# Patient Record
Sex: Female | Born: 1990 | Race: Black or African American | Hispanic: No | Marital: Single | Smoking: Current some day smoker
Health system: Southern US, Community
[De-identification: ages and names within clinical notes are randomized; demographics above are authoritative.]

## PROBLEM LIST (undated history)

## (undated) DIAGNOSIS — E162 Hypoglycemia, unspecified: Secondary | ICD-10-CM

## (undated) DIAGNOSIS — D649 Anemia, unspecified: Secondary | ICD-10-CM

## (undated) DIAGNOSIS — F329 Major depressive disorder, single episode, unspecified: Secondary | ICD-10-CM

## (undated) DIAGNOSIS — A749 Chlamydial infection, unspecified: Secondary | ICD-10-CM

## (undated) DIAGNOSIS — B999 Unspecified infectious disease: Secondary | ICD-10-CM

## (undated) DIAGNOSIS — F32A Depression, unspecified: Secondary | ICD-10-CM

## (undated) DIAGNOSIS — F419 Anxiety disorder, unspecified: Secondary | ICD-10-CM

## (undated) DIAGNOSIS — J45909 Unspecified asthma, uncomplicated: Secondary | ICD-10-CM

## (undated) DIAGNOSIS — R519 Headache, unspecified: Secondary | ICD-10-CM

## (undated) HISTORY — PX: DILATION AND CURETTAGE OF UTERUS: SHX78

---
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
## 2018-10-04 ENCOUNTER — Other Ambulatory Visit: Payer: Self-pay

## 2018-10-04 ENCOUNTER — Encounter (HOSPITAL_COMMUNITY): Payer: Self-pay

## 2018-10-04 ENCOUNTER — Inpatient Hospital Stay (HOSPITAL_COMMUNITY): Payer: Self-pay

## 2018-10-04 ENCOUNTER — Inpatient Hospital Stay (HOSPITAL_COMMUNITY)
Admission: AD | Admit: 2018-10-04 | Discharge: 2018-10-04 | Disposition: A | Discharge status: Home / Self Care | Payer: Self-pay | Attending: Obstetrics and Gynecology | Admitting: Obstetrics and Gynecology

## 2018-10-04 DIAGNOSIS — F17210 Nicotine dependence, cigarettes, uncomplicated: Secondary | ICD-10-CM | POA: Insufficient documentation

## 2018-10-04 DIAGNOSIS — N83291 Other ovarian cyst, right side: Secondary | ICD-10-CM | POA: Insufficient documentation

## 2018-10-04 DIAGNOSIS — Z3A10 10 weeks gestation of pregnancy: Secondary | ICD-10-CM | POA: Insufficient documentation

## 2018-10-04 DIAGNOSIS — Z3A13 13 weeks gestation of pregnancy: Secondary | ICD-10-CM

## 2018-10-04 DIAGNOSIS — O99331 Smoking (tobacco) complicating pregnancy, first trimester: Secondary | ICD-10-CM | POA: Insufficient documentation

## 2018-10-04 DIAGNOSIS — O3481 Maternal care for other abnormalities of pelvic organs, first trimester: Secondary | ICD-10-CM | POA: Insufficient documentation

## 2018-10-04 DIAGNOSIS — O209 Hemorrhage in early pregnancy, unspecified: Secondary | ICD-10-CM | POA: Insufficient documentation

## 2018-10-04 HISTORY — DX: Anxiety disorder, unspecified: F41.9

## 2018-10-04 HISTORY — DX: Anemia, unspecified: D64.9

## 2018-10-04 LAB — CBC WITH DIFFERENTIAL/PLATELET
Abs Immature Granulocytes: 0.03 10*3/uL (ref 0.00–0.07)
Basophils Absolute: 0.1 10*3/uL (ref 0.0–0.1)
Basophils Relative: 1 %
Eosinophils Absolute: 0.2 10*3/uL (ref 0.0–0.5)
Eosinophils Relative: 2 %
HCT: 36.1 % (ref 36.0–46.0)
Hemoglobin: 12.4 g/dL (ref 12.0–15.0)
Immature Granulocytes: 0 %
Lymphocytes Relative: 32 %
Lymphs Abs: 3.5 10*3/uL (ref 0.7–4.0)
MCH: 31.3 pg (ref 26.0–34.0)
MCHC: 34.3 g/dL (ref 30.0–36.0)
MCV: 91.2 fL (ref 80.0–100.0)
Monocytes Absolute: 0.7 10*3/uL (ref 0.1–1.0)
Monocytes Relative: 7 %
Neutro Abs: 6.6 10*3/uL (ref 1.7–7.7)
Neutrophils Relative %: 58 %
Platelets: 238 10*3/uL (ref 150–400)
RBC: 3.96 MIL/uL (ref 3.87–5.11)
RDW: 13 % (ref 11.5–15.5)
WBC: 11.1 10*3/uL — ABNORMAL HIGH (ref 4.0–10.5)
nRBC: 0 % (ref 0.0–0.2)

## 2018-10-04 LAB — URINALYSIS, ROUTINE W REFLEX MICROSCOPIC
Bilirubin Urine: NEGATIVE
Glucose, UA: NEGATIVE mg/dL
Ketones, ur: NEGATIVE mg/dL
Leukocytes,Ua: NEGATIVE
Nitrite: NEGATIVE
Protein, ur: NEGATIVE mg/dL
Specific Gravity, Urine: 1.017 (ref 1.005–1.030)
pH: 5 (ref 5.0–8.0)

## 2018-10-04 LAB — HCG, QUANTITATIVE, PREGNANCY: hCG, Beta Chain, Quant, S: 149221 m[IU]/mL — ABNORMAL HIGH (ref <5)

## 2018-10-04 LAB — ABO/RH: ABO/RH(D): A POS

## 2018-10-04 LAB — POCT PREGNANCY, URINE: Preg Test, Ur: POSITIVE — AB

## 2018-10-04 MED ORDER — PREPLUS 27-1 MG PO TABS: 1.0000 | ORAL_TABLET | Freq: Every day | ORAL | 13 refills | Status: AC

## 2018-10-04 NOTE — Discharge Instructions (Signed)
First Trimester of Pregnancy ° °The first trimester of pregnancy is from week 1 until the end of week 13 (months 1 through 3). During this time, your baby will begin to develop inside you. At 6-8 weeks, the eyes and face are formed, and the heartbeat can be seen on ultrasound. At the end of 12 weeks, all the baby's organs are formed. Prenatal care is all the medical care you receive before the birth of your baby. Make sure you get good prenatal care and follow all of your doctor's instructions. °Follow these instructions at home: °Medicines °· Take over-the-counter and prescription medicines only as told by your doctor. Some medicines are safe and some medicines are not safe during pregnancy. °· Take a prenatal vitamin that contains at least 600 micrograms (mcg) of folic acid. °· If you have trouble pooping (constipation), take medicine that will make your stool soft (stool softener) if your doctor approves. °Eating and drinking ° °· Eat regular, healthy meals. °· Your doctor will tell you the amount of weight gain that is right for you. °· Avoid raw meat and uncooked cheese. °· If you feel sick to your stomach (nauseous) or throw up (vomit): °? Eat 4 or 5 small meals a day instead of 3 large meals. °? Try eating a few soda crackers. °? Drink liquids between meals instead of during meals. °· To prevent constipation: °? Eat foods that are high in fiber, like fresh fruits and vegetables, whole grains, and beans. °? Drink enough fluids to keep your pee (urine) clear or pale yellow. °Activity °· Exercise only as told by your doctor. Stop exercising if you have cramps or pain in your lower belly (abdomen) or low back. °· Do not exercise if it is too hot, too humid, or if you are in a place of great height (high altitude). °· Try to avoid standing for long periods of time. Move your legs often if you must stand in one place for a long time. °· Avoid heavy lifting. °· Wear low-heeled shoes. Sit and stand up  straight. °· You can have sex unless your doctor tells you not to. °Relieving pain and discomfort °· Wear a good support bra if your breasts are sore. °· Take warm water baths (sitz baths) to soothe pain or discomfort caused by hemorrhoids. Use hemorrhoid cream if your doctor says it is okay. °· Rest with your legs raised if you have leg cramps or low back pain. °· If you have puffy, bulging veins (varicose veins) in your legs: °? Wear support hose or compression stockings as told by your doctor. °? Raise (elevate) your feet for 15 minutes, 3-4 times a day. °? Limit salt in your food. °Prenatal care °· Schedule your prenatal visits by the twelfth week of pregnancy. °· Write down your questions. Take them to your prenatal visits. °· Keep all your prenatal visits as told by your doctor. This is important. °Safety °· Wear your seat belt at all times when driving. °· Make a list of emergency phone numbers. The list should include numbers for family, friends, the hospital, and police and fire departments. °General instructions °· Ask your doctor for a referral to a local prenatal class. Begin classes no later than at the start of month 6 of your pregnancy. °· Ask for help if you need counseling or if you need help with nutrition. Your doctor can give you advice or tell you where to go for help. °· Do not use hot tubs, steam   rooms, or saunas.  Do not douche or use tampons or scented sanitary pads.  Do not cross your legs for long periods of time.  Avoid all herbs and alcohol. Avoid drugs that are not approved by your doctor.  Do not use any tobacco products, including cigarettes, chewing tobacco, and electronic cigarettes. If you need help quitting, ask your doctor. You may get counseling or other support to help you quit.  Avoid cat litter boxes and soil used by cats. These carry germs that can cause birth defects in the baby and can cause a loss of your baby (miscarriage) or stillbirth.  Visit your dentist.  At home, brush your teeth with a soft toothbrush. Be gentle when you floss. Contact a doctor if:  You are dizzy.  You have mild cramps or pressure in your lower belly.  You have a nagging pain in your belly area.  You continue to feel sick to your stomach, you throw up, or you have watery poop (diarrhea).  You have a bad smelling fluid coming from your vagina.  You have pain when you pee (urinate).  You have increased puffiness (swelling) in your face, hands, legs, or ankles. Get help right away if:  You have a fever.  You are leaking fluid from your vagina.  You have spotting or bleeding from your vagina.  You have very bad belly cramping or pain.  You gain or lose weight rapidly.  You throw up blood. It may look like coffee grounds.  You are around people who have [place] measles, fifth disease, or chickenpox.  You have a very bad headache.  You have shortness of breath.  You have any kind of trauma, such as from a fall or a car accident. Summary  The first trimester of pregnancy is from week 1 until the end of week 13 (months 1 through 3).  To take care of yourself and your unborn baby, you will need to eat healthy meals, take medicines only if your doctor tells you to do so, and do activities that are safe for you and your baby.  Keep all follow-up visits as told by your doctor. This is important as your doctor will have to ensure that your baby is healthy and growing well. This information is not intended to replace advice given to you by your health care provider. Make sure you discuss any questions you have with your health care provider. Document Released: 09/02/2007 Document Revised: 07/07/2018 Document Reviewed: 03/24/2016 Elsevier Patient Education  2020 [place] for [place] @ [facility]   Phone: [place] for [place] @ [facility]   Phone: [place] @[place]  [facility]       Phone: [place] for [place] @ [place]     Phone: [place] for [place] @ [employer] [facility]   Phone: [place] for [place] @ [facility]  Phone: [place] for [place] @ [facility] Phone: [place] [facility]  Phone: [facility]  Phone: [facility] Phone: [phone]  [facility] Phone: [phone]  [facility] Phone: [phone]  [facility] Phone: [phone]  [facility]  Phone: [phone]

## 2018-10-04 NOTE — MAU Provider Note (Signed)
History     CSN: [id number]  Arrival date and time: 10/04/18 0737   First Provider Initiated Contact with Patient 10/04/18 [phone]     Chief Complaint  Patient presents with  . Vaginal Bleeding  . Abdominal Pain   HPI Oris Calmes is a 28 y.o. [id number] at [redacted]w[redacted]d who presents to MAU via EMS with chief complaints of vaginal bleeding and abdominal pain. These are new problems, onset this morning around 0300. She reports continuous bleeding since that time. Her abdominal pain is suprapubic and waxes and wanes. She denies aggravating or alleviating factors. She rates her pain as 6/10 upon CNM initial introduction.  She denies recent intercourse, illness, abdominal tenderness, dysuria, fever.  OB History    Gravida  6   Para      Term      Preterm      AB  3   Living  2     SAB  2   TAB  1   Ectopic      Multiple      Live Births              Past Medical History:  Diagnosis Date  . Anemia   . Anxiety     History reviewed. No pertinent surgical history.  History reviewed. No pertinent family history.  Social History   Tobacco Use  . Smoking status: Current Some Day Smoker    Packs/day: 0.25    Years: 15.00    Pack years: 3.75    Types: Cigarettes  . Smokeless tobacco: Never Used  Substance Use Topics  . Alcohol use: Not on file  . Drug use: Not on file    Allergies:  Allergies  Allergen Reactions  . Tylenol [Acetaminophen] Hives    No medications prior to admission.    Review of Systems  Constitutional: Positive for fever.  Gastrointestinal: Positive for abdominal pain.  Genitourinary: Positive for vaginal bleeding. Negative for difficulty urinating, dysuria and flank pain.  Musculoskeletal: Negative for back pain.  Neurological: Negative for syncope and weakness.  All other systems reviewed and are negative.  Physical Exam   Blood pressure (!) 107/47, pulse 72, temperature 98.5 F (36.9 C), temperature source Oral, resp. rate 18,  height 5\' 3"  (1.6 m), last menstrual period 06/30/2018, SpO2 97 %.  Physical Exam  Nursing note and vitals reviewed. Constitutional: She is oriented to person, place, and time. She appears well-developed and well-nourished.  Cardiovascular: Normal rate.  Respiratory: Effort normal.  GI: Soft. Bowel sounds are normal. She exhibits no distension. There is no abdominal tenderness. There is no rebound and no guarding.  Genitourinary:    Vaginal discharge present.     Genitourinary Comments: Scant red-tinged mucous throughout vaginal vault. Removed with fox swab x 1.   Neurological: She is alert and oriented to person, place, and time.  Skin: Skin is warm and dry.  Psychiatric: She has a normal mood and affect. Her behavior is normal. Judgment and thought content normal.    MAU Course/MDM  Procedures: sterile speculum exam  --Hematuria noted on UA. Pertinent negatives include dysuria, foul-smelling urine, fever, hx UTIs or kidney stones. Discussed warning signs with patient prior to discharge. --Patient is sleeping following [place], denies pain at time of discharge  Patient Vitals for the past 24 hrs:  BP Temp Temp src Pulse Resp SpO2 Height  10/04/18 0547 (!) 107/47 98.5 F (36.9 C) Oral 72 18 97 % -  10/04/18 0413 (!) 109/58 - -  74 - 99 % -  10/04/18 0400 (!) 104/48 98.5 F (36.9 C) Oral 80 18 100 % 5\' 3"  (1.6 m)    Results for orders placed or performed during the hospital encounter of 10/04/18 (from the past 24 hour(s))  Pregnancy, urine POC     Status: Abnormal   Collection Time: 10/04/18  3:55 AM  Result Value Ref Range   Preg Test, Ur POSITIVE (A) NEGATIVE  Urinalysis, Routine w reflex microscopic     Status: Abnormal   Collection Time: 10/04/18  4:12 AM  Result Value Ref Range   Color, Urine YELLOW YELLOW   APPearance CLEAR CLEAR   Specific Gravity, Urine 1.017 1.005 - 1.030   pH 5.0 5.0 - 8.0   Glucose, UA NEGATIVE NEGATIVE mg/dL   Hgb urine dipstick LARGE (A) NEGATIVE    Bilirubin Urine NEGATIVE NEGATIVE   Ketones, ur NEGATIVE NEGATIVE mg/dL   Protein, ur NEGATIVE NEGATIVE mg/dL   Nitrite NEGATIVE NEGATIVE   Leukocytes,Ua NEGATIVE NEGATIVE   RBC / HPF 11-20 0 - 5 RBC/hpf   WBC, UA 0-5 0 - 5 WBC/hpf   Bacteria, UA RARE (A) NONE SEEN   Squamous Epithelial / LPF 0-5 0 - 5   Mucus PRESENT   CBC with Differential/Platelet     Status: Abnormal   Collection Time: 10/04/18  4:50 AM  Result Value Ref Range   WBC 11.1 (H) 4.0 - 10.5 K/uL   RBC 3.96 3.87 - 5.11 MIL/uL   Hemoglobin 12.4 12.0 - 15.0 g/dL   HCT 40.936.1 81.136.0 - 91.446.0 %   MCV 91.2 80.0 - 100.0 fL   MCH 31.3 26.0 - 34.0 pg   MCHC 34.3 30.0 - 36.0 g/dL   RDW 78.213.0 95.611.5 - 21.315.5 %   Platelets 238 150 - 400 K/uL   nRBC 0.0 0.0 - 0.2 %   Neutrophils Relative % 58 %   Neutro Abs 6.6 1.7 - 7.7 K/uL   Lymphocytes Relative 32 %   Lymphs Abs 3.5 0.7 - 4.0 K/uL   Monocytes Relative 7 %   Monocytes Absolute 0.7 0.1 - 1.0 K/uL   Eosinophils Relative 2 %   Eosinophils Absolute 0.2 0.0 - 0.5 K/uL   Basophils Relative 1 %   Basophils Absolute 0.1 0.0 - 0.1 K/uL   Immature Granulocytes 0 %   Abs Immature Granulocytes 0.03 0.00 - 0.07 K/uL  ABO/Rh     Status: None   Collection Time: 10/04/18  4:50 AM  Result Value Ref Range   ABO/RH(D) A POS    No rh immune globuloin      NOT A RH IMMUNE GLOBULIN CANDIDATE, PT RH POSITIVE Performed at [facility], [address] [place], [place] [address]   hCG, quantitative, pregnancy     Status: Abnormal   Collection Time: 10/04/18  4:50 AM  Result Value Ref Range   hCG, Beta Chain, Quant, S 149,221 (H) <5 mIU/mL   [place] Ob Less Than 14 Weeks With Ob Transvaginal  Result Date: 10/04/2018 CLINICAL DATA:  Pregnancy.  Cramping.  Bleeding. EXAM: OBSTETRIC <14 WK ULTRASOUND TECHNIQUE: Transabdominal ultrasound was performed for evaluation of the gestation as well as the maternal uterus and adnexal regions. COMPARISON:  No prior. FINDINGS: Intrauterine gestational sac:  Single Yolk sac:  No Embryo:  Yes Cardiac Activity: Yes Heart Rate: 150 bpm CRL: 36.2 mm   10 w 4 d  US EDC: 04/27/2000 Subchorionic hemorrhage:  None Maternal uterus/adnexae: Prominent vasculature noted about the uterus most likely related to the patient's pregnancy. Tiny right ovarian complex cyst most likely corpus luteal cyst. IMPRESSION: 1.  Single viable intrauterine pregnancy at 10 weeks 4 days. 2. Prominent vasculature noted about the uterus most likely related to the patient's pregnancy. Continued follow-up suggested to demonstrate stability. Tiny right ovarian corpus luteal cyst. Electronically Signed   By: Maisie Fushomas  Register   On: 10/04/2018 05:51   Meds ordered this encounter  Medications  . Prenatal Vit-Fe Fumarate-FA (PREPLUS) 27-1 MG TABS    Sig: Take 1 tablet by mouth daily.    Dispense:  30 tablet    Refill:  13    Order Specific Question:   Supervising Provider    Answer:   Hermina StaggersERVIN, MICHAEL L [[id number]]   Assessment and Plan  --28 y.o. [id number] at 964w4d by US  --A POS blood type --Discharge home in stable condition with first trimester precautions.  F/U: Patient to establish prenatal care around 11-[redacted] weeks gestation  Calvert CantorSamantha C , [place] 10/04/2018, 6:39 AM

## 2018-10-04 NOTE — MAU Note (Addendum)
Patient presents to MAU via EMS c/o abdominal pain that started yesterday and vaginal bleeding that started around 0300 this morning.  Patient reports unconfirmed pregnancy. Patient reports LMP April 2.

## 2018-11-20 ENCOUNTER — Emergency Department (HOSPITAL_COMMUNITY): Admission: EM | Admit: 2018-11-20 | Discharge: 2018-11-20 | Discharge status: Absent without leave | Payer: Self-pay

## 2018-11-22 ENCOUNTER — Inpatient Hospital Stay (HOSPITAL_BASED_OUTPATIENT_CLINIC_OR_DEPARTMENT_OTHER): Payer: Self-pay

## 2018-11-22 ENCOUNTER — Encounter (HOSPITAL_COMMUNITY): Payer: Self-pay | Admitting: *Deleted

## 2018-11-22 ENCOUNTER — Other Ambulatory Visit: Payer: Self-pay

## 2018-11-22 ENCOUNTER — Inpatient Hospital Stay (HOSPITAL_COMMUNITY)
Admission: AD | Admit: 2018-11-22 | Discharge: 2018-11-22 | Disposition: A | Discharge status: Home / Self Care | Payer: Self-pay | Attending: Family Medicine | Admitting: Family Medicine

## 2018-11-22 DIAGNOSIS — O26892 Other specified pregnancy related conditions, second trimester: Secondary | ICD-10-CM | POA: Insufficient documentation

## 2018-11-22 DIAGNOSIS — E860 Dehydration: Secondary | ICD-10-CM | POA: Insufficient documentation

## 2018-11-22 DIAGNOSIS — Z886 Allergy status to analgesic agent status: Secondary | ICD-10-CM | POA: Insufficient documentation

## 2018-11-22 DIAGNOSIS — R109 Unspecified abdominal pain: Secondary | ICD-10-CM

## 2018-11-22 DIAGNOSIS — Z3A17 17 weeks gestation of pregnancy: Secondary | ICD-10-CM | POA: Insufficient documentation

## 2018-11-22 DIAGNOSIS — O09212 Supervision of pregnancy with history of pre-term labor, second trimester: Secondary | ICD-10-CM

## 2018-11-22 DIAGNOSIS — O26899 Other specified pregnancy related conditions, unspecified trimester: Secondary | ICD-10-CM

## 2018-11-22 DIAGNOSIS — Z833 Family history of diabetes mellitus: Secondary | ICD-10-CM | POA: Insufficient documentation

## 2018-11-22 DIAGNOSIS — O99282 Endocrine, nutritional and metabolic diseases complicating pregnancy, second trimester: Secondary | ICD-10-CM | POA: Insufficient documentation

## 2018-11-22 DIAGNOSIS — O4692 Antepartum hemorrhage, unspecified, second trimester: Secondary | ICD-10-CM

## 2018-11-22 DIAGNOSIS — Z8249 Family history of ischemic heart disease and other diseases of the circulatory system: Secondary | ICD-10-CM | POA: Insufficient documentation

## 2018-11-22 DIAGNOSIS — Z87440 Personal history of urinary (tract) infections: Secondary | ICD-10-CM | POA: Insufficient documentation

## 2018-11-22 DIAGNOSIS — O99332 Smoking (tobacco) complicating pregnancy, second trimester: Secondary | ICD-10-CM | POA: Insufficient documentation

## 2018-11-22 DIAGNOSIS — R1030 Lower abdominal pain, unspecified: Secondary | ICD-10-CM | POA: Insufficient documentation

## 2018-11-22 DIAGNOSIS — O09892 Supervision of other high risk pregnancies, second trimester: Secondary | ICD-10-CM

## 2018-11-22 DIAGNOSIS — F17210 Nicotine dependence, cigarettes, uncomplicated: Secondary | ICD-10-CM | POA: Insufficient documentation

## 2018-11-22 HISTORY — DX: Chlamydial infection, unspecified: A74.9

## 2018-11-22 HISTORY — DX: Unspecified asthma, uncomplicated: J45.909

## 2018-11-22 HISTORY — DX: Hypoglycemia, unspecified: E16.2

## 2018-11-22 HISTORY — DX: Headache, unspecified: R51.9

## 2018-11-22 HISTORY — DX: Unspecified infectious disease: B99.9

## 2018-11-22 HISTORY — DX: Depression, unspecified: F32.A

## 2018-11-22 LAB — URINALYSIS, ROUTINE W REFLEX MICROSCOPIC
Bilirubin Urine: NEGATIVE
Glucose, UA: NEGATIVE mg/dL
Hgb urine dipstick: NEGATIVE
Ketones, ur: 5 mg/dL — AB
Nitrite: NEGATIVE
Protein, ur: 30 mg/dL — AB
Specific Gravity, Urine: 1.028 (ref 1.005–1.030)
pH: 5 (ref 5.0–8.0)

## 2018-11-22 LAB — WET PREP, GENITAL
Sperm: NONE SEEN
Trich, Wet Prep: NONE SEEN
Yeast Wet Prep HPF POC: NONE SEEN

## 2018-11-22 MED ORDER — IBUPROFEN 600 MG PO TABS
600.0000 mg | ORAL_TABLET | Freq: Once | ORAL | Status: AC
  Administered 2018-11-22: 600 mg via ORAL
  Filled 2018-11-22: qty 1

## 2018-11-22 NOTE — Discharge Instructions (Signed)
Abdominal Pain During Pregnancy ° °Belly (abdominal) pain is common during pregnancy. There are many possible causes. Most of the time, it is not a serious problem. Other times, it can be a sign that something is wrong with the pregnancy. Always tell your doctor if you have belly pain. °Follow these instructions at home: °· Do not have sex or put anything in your vagina until your pain goes away completely. °· Get plenty of rest until your pain gets better. °· Drink enough fluid to keep your pee (urine) pale yellow. °· Take over-the-counter and prescription medicines only as told by your doctor. °· Keep all follow-up visits as told by your doctor. This is important. °Contact a doctor if: °· Your pain continues or gets worse after resting. °· You have lower belly pain that: °? Comes and goes at regular times. °? Spreads to your back. °? Feels like menstrual cramps. °· You have pain or burning when you pee (urinate). °Get help right away if: °· You have a fever or chills. °· You have vaginal bleeding. °· You are leaking fluid from your vagina. °· You are passing tissue from your vagina. °· You throw up (vomit) for more than 24 hours. °· You have watery poop (diarrhea) for more than 24 hours. °· Your baby is moving less than usual. °· You feel very weak or faint. °· You have shortness of breath. °· You have very bad pain in your upper belly. °Summary °· Belly (abdominal) pain is common during pregnancy. There are many possible causes. °· If you have belly pain during pregnancy, tell your doctor right away. °· Keep all follow-up visits as told by your doctor. This is important. °This information is not intended to replace advice given to you by your health care provider. Make sure you discuss any questions you have with your health care provider. °Document Released: 03/04/2009 Document Revised: 07/04/2018 Document Reviewed: 06/18/2016 °Elsevier Patient Education © 2020 Elsevier Inc. ° °

## 2018-11-22 NOTE — MAU Note (Signed)
Keeps having sporadic cramps, feels kind of like contractions.  Started early this morning. Was once an hour, now every 74min. This morning had a mixture of d/c and blood, no longer seeing blood.

## 2018-11-22 NOTE — MAU Provider Note (Signed)
History     CSN: [id number]  Arrival date and time: 11/22/18 1657   First Provider Initiated Contact with Patient 11/22/18 1834      Chief Complaint  Patient presents with  . Abdominal Pain  . Vaginal Bleeding   HPI   Ms.Kathleen Curtis is a 28 y.o. female [phone] @ 2343w4d here in MAU with complaints of abdominal pain that started this morning when she woke up. The pain is all across her lower abdomen and at times radiates around to her lower back. She has not started prenatal care because she just recently moved here and she is waiting on medicaid. She has had bleeding off and on throughout the pregnancy d/t unknown reasons. She had a fall last week and thinks she may be sore from the fall.   OB History    Gravida  6   Para  2   Term  1   Preterm  1   AB  3   Living  2     SAB  2   TAB  1   Ectopic      Multiple      Live Births  2           Past Medical History:  Diagnosis Date  . Anemia   . Anxiety   . Asthma   . Chlamydia   . Depression   . Headache   . Hypoglycemia    with preg  . Infection    UTI    Past Surgical History:  Procedure Laterality Date  . abortion    . DILATION AND CURETTAGE OF UTERUS    . NO PAST SURGERIES      Family History  Problem Relation Age of Onset  . Diabetes Mother   . Hypertension Mother   . Aneurysm Mother   . Diabetes Father   . Hypertension Father     Social History   Tobacco Use  . Smoking status: Current Some Day Smoker    Packs/day: 0.25    Years: 15.00    Pack years: 3.75    Types: Cigarettes  . Smokeless tobacco: Never Used  Substance Use Topics  . Alcohol use: Never    Frequency: Never  . Drug use: Never    Allergies:  Allergies  Allergen Reactions  . Tylenol [Acetaminophen] Hives    Medications Prior to Admission  Medication Sig Dispense Refill Last Dose  . Prenatal Vit-Fe Fumarate-FA (PREPLUS) 27-1 MG TABS Take 1 tablet by mouth daily. 30 tablet 13 11/22/2018 at Unknown time    Results for orders placed or performed during the hospital encounter of 11/22/18 (from the past 48 hour(s))  Urinalysis, Routine w reflex microscopic     Status: Abnormal   Collection Time: 11/22/18  6:15 PM  Result Value Ref Range   Color, Urine YELLOW YELLOW   APPearance CLOUDY (A) CLEAR   Specific Gravity, Urine 1.028 1.005 - 1.030   pH 5.0 5.0 - 8.0   Glucose, UA NEGATIVE NEGATIVE mg/dL   Hgb urine dipstick NEGATIVE NEGATIVE   Bilirubin Urine NEGATIVE NEGATIVE   Ketones, ur 5 (A) NEGATIVE mg/dL   Protein, ur 30 (A) NEGATIVE mg/dL   Nitrite NEGATIVE NEGATIVE   Leukocytes,Ua MODERATE (A) NEGATIVE   RBC / HPF 0-5 0 - 5 RBC/hpf   WBC, UA 11-20 0 - 5 WBC/hpf   Bacteria, UA FEW (A) NONE SEEN   Squamous Epithelial / LPF 21-50 0 - 5   Mucus PRESENT  Comment: Performed at [facility], [place] [address], [place], [place] [address]  Wet prep, genital     Status: Abnormal   Collection Time: 11/22/18  6:48 PM   Specimen: Cervical/Vaginal swab  Result Value Ref Range   Yeast Wet Prep HPF POC NONE SEEN NONE SEEN   Trich, Wet Prep NONE SEEN NONE SEEN   Clue Cells Wet Prep HPF POC PRESENT (A) NONE SEEN   WBC, Wet Prep HPF POC MANY (A) NONE SEEN   Sperm NONE SEEN     Comment: Performed at [facility], [place] [address], [place], [place] [address]   No results found.  Review of Systems  Constitutional: Negative for fever.  Gastrointestinal: Positive for abdominal pain.  Genitourinary: Negative for dysuria and vaginal bleeding.   Physical Exam   Blood pressure (!) 106/59, pulse 69, temperature 98.3 F (36.8 C), temperature source Oral, resp. rate 16, height 5\' 3"  (1.6 m), weight 80.9 kg, last menstrual period 06/30/2018, SpO2 100 %.  Physical Exam  Constitutional: She is oriented to person, place, and time. She appears well-developed and well-nourished. No distress.  HENT:  Head: Normocephalic.  Genitourinary:    Genitourinary Comments: Vagina - Small amount of white  vaginal discharge, no odor  Cervix - No contact bleeding, no active bleeding  Bimanual exam: Cervix closed, thick, long GC/Chlam, wet prep done Chaperone present for exam.    Musculoskeletal: Normal range of motion.  Neurological: She is alert and oriented to person, place, and time.  Skin: Skin is warm. She is not diaphoretic.  Psychiatric: Her behavior is normal.   MAU Course  Procedures  None  MDM  UA, Wet prep, GC.  Ibuprofen 600 mg PO given. Patient feeling much better.  US shows normal cervical length.   Assessment and Plan   A:  1. Abdominal pain in pregnancy, second trimester   2. Abdominal pain in pregnancy   3. History of preterm delivery, currently pregnant in second trimester   4. [redacted] weeks gestation of pregnancy   5. Mild dehydration      P:  Discharge home in stable condition Strict return precautions Call the [place] office to set up initial prenatal care.  Increase oral fluid intake Urine culture pending Ok to use ibuprofen in the 2nd trimester of pregnancy only. As directed on the bottle.  Lezlie Lye, NP 11/23/2018 9:35 AM

## 2018-11-23 LAB — CULTURE, OB URINE: Special Requests: NORMAL

## 2018-11-24 ENCOUNTER — Telehealth: Payer: Self-pay | Admitting: Medical

## 2018-11-24 DIAGNOSIS — A749 Chlamydial infection, unspecified: Secondary | ICD-10-CM

## 2018-11-24 LAB — GC/CHLAMYDIA PROBE AMP (~~LOC~~) NOT AT ARMC
Chlamydia: POSITIVE — AB
Neisseria Gonorrhea: NEGATIVE

## 2018-11-24 MED ORDER — AZITHROMYCIN 250 MG PO TABS: 1000.0000 mg | ORAL_TABLET | Freq: Once | ORAL | 0 refills | Status: AC

## 2018-11-24 NOTE — Telephone Encounter (Addendum)
Tona Lariccia tested positive for  Chlamydia. Patient was called by RN and allergies and pharmacy confirmed. Rx sent to pharmacy of choice.   Luvenia Redden, PA-C 11/24/2018 3:16 PM      ----- Message from Bjorn Loser, RN sent at 11/24/2018  3:08 PM EDT ----- This patient tested positive for :  Chlamydia  She "is allergic to Acetaminophen",  I have informed the patient of her results and confirmed her pharmacy is correct in her chart. Please send Rx.   Thank you,   Bjorn Loser, RN   Results faxed to [facility] Department.

## 2018-11-26 ENCOUNTER — Other Ambulatory Visit: Payer: Self-pay | Admitting: Obstetrics and Gynecology

## 2018-11-26 MED ORDER — AZITHROMYCIN 500 MG PO TABS: 1000.0000 mg | ORAL_TABLET | Freq: Once | ORAL | 0 refills | Status: AC

## 2018-11-26 NOTE — Progress Notes (Signed)
+  chlamydia. Rx sent  Ashlee Player, Artist Pais, NP 11/26/2018 4:35 PM

## 2019-03-09 ENCOUNTER — Other Ambulatory Visit: Payer: Self-pay

## 2019-03-09 ENCOUNTER — Inpatient Hospital Stay (HOSPITAL_COMMUNITY)
Admission: AD | Admit: 2019-03-09 | Discharge: 2019-03-09 | Disposition: A | Discharge status: Home / Self Care | Payer: Self-pay | Attending: Obstetrics and Gynecology | Admitting: Obstetrics and Gynecology

## 2019-03-09 ENCOUNTER — Encounter (HOSPITAL_COMMUNITY): Payer: Self-pay | Admitting: Obstetrics and Gynecology

## 2019-03-09 DIAGNOSIS — Z3A32 32 weeks gestation of pregnancy: Secondary | ICD-10-CM | POA: Insufficient documentation

## 2019-03-09 DIAGNOSIS — M549 Dorsalgia, unspecified: Secondary | ICD-10-CM

## 2019-03-09 DIAGNOSIS — Z886 Allergy status to analgesic agent status: Secondary | ICD-10-CM | POA: Insufficient documentation

## 2019-03-09 DIAGNOSIS — O99333 Smoking (tobacco) complicating pregnancy, third trimester: Secondary | ICD-10-CM | POA: Insufficient documentation

## 2019-03-09 DIAGNOSIS — R12 Heartburn: Secondary | ICD-10-CM | POA: Insufficient documentation

## 2019-03-09 DIAGNOSIS — O368130 Decreased fetal movements, third trimester, not applicable or unspecified: Secondary | ICD-10-CM | POA: Insufficient documentation

## 2019-03-09 DIAGNOSIS — R102 Pelvic and perineal pain: Secondary | ICD-10-CM | POA: Insufficient documentation

## 2019-03-09 DIAGNOSIS — M545 Low back pain: Secondary | ICD-10-CM | POA: Insufficient documentation

## 2019-03-09 DIAGNOSIS — O26892 Other specified pregnancy related conditions, second trimester: Secondary | ICD-10-CM

## 2019-03-09 DIAGNOSIS — O26893 Other specified pregnancy related conditions, third trimester: Secondary | ICD-10-CM | POA: Insufficient documentation

## 2019-03-09 DIAGNOSIS — A749 Chlamydial infection, unspecified: Secondary | ICD-10-CM

## 2019-03-09 DIAGNOSIS — N949 Unspecified condition associated with female genital organs and menstrual cycle: Secondary | ICD-10-CM

## 2019-03-09 DIAGNOSIS — Z3689 Encounter for other specified antenatal screening: Secondary | ICD-10-CM

## 2019-03-09 DIAGNOSIS — F17210 Nicotine dependence, cigarettes, uncomplicated: Secondary | ICD-10-CM | POA: Insufficient documentation

## 2019-03-09 DIAGNOSIS — O98819 Other maternal infectious and parasitic diseases complicating pregnancy, unspecified trimester: Secondary | ICD-10-CM

## 2019-03-09 LAB — URINALYSIS, ROUTINE W REFLEX MICROSCOPIC
Bilirubin Urine: NEGATIVE
Glucose, UA: NEGATIVE mg/dL
Hgb urine dipstick: NEGATIVE
Ketones, ur: NEGATIVE mg/dL
Nitrite: NEGATIVE
Protein, ur: 30 mg/dL — AB
Specific Gravity, Urine: 1.026 (ref 1.005–1.030)
pH: 6 (ref 5.0–8.0)

## 2019-03-09 LAB — WET PREP, GENITAL
Clue Cells Wet Prep HPF POC: NONE SEEN
Sperm: NONE SEEN
Trich, Wet Prep: NONE SEEN
Yeast Wet Prep HPF POC: NONE SEEN

## 2019-03-09 MED ORDER — CYCLOBENZAPRINE HCL 10 MG PO TABS: 10.0000 mg | ORAL_TABLET | Freq: Two times a day (BID) | ORAL | 0 refills | Status: AC | PRN

## 2019-03-09 MED ORDER — ALUM & MAG HYDROXIDE-SIMETH 200-200-20 MG/5ML PO SUSP
30.0000 mL | Freq: Once | ORAL | Status: AC
  Administered 2019-03-09: 30 mL via ORAL
  Filled 2019-03-09: qty 30

## 2019-03-09 MED ORDER — CYCLOBENZAPRINE HCL 10 MG PO TABS
10.0000 mg | ORAL_TABLET | Freq: Once | ORAL | Status: AC
  Administered 2019-03-09: 17:00:00 10 mg via ORAL
  Filled 2019-03-09: qty 1

## 2019-03-09 NOTE — Discharge Instructions (Signed)

## 2019-03-09 NOTE — MAU Provider Note (Signed)
History     CSN: [id number]  Arrival date and time: 03/09/19 1515   First Provider Initiated Contact with Patient 03/09/19 1620      Chief Complaint  Patient presents with  . Decreased Fetal Movement  . Back Pain  . chronic heartburn   HPI Kathleen Curtis is a 28 y.o. [id number] at [redacted]w[redacted]d who presents to MAU with multiple complaints:  Decreased fetal movement This is a new problem, onset yesterday morning. Patient states she has not felt her baby move at all since that time. She is unaware of interventions to stimulate fetal movement. She is aware of fetal movement on initial assessment in MAU.   Low back pain This is a recurrent problem, onset three weeks ago. Patient endorses "sore" pain bilaterally across her lower back. She rates her pain as 9/10. Aggravated by prolonged walking or standing. She has not taken medication or tried other treatments for this complaint.  Heartburn This is a recurrent problem within the past month. Patient is requesting relief during her evaluation MAU.  She denies vaginal bleeding, leaking of fluid, dysuria, fever, falls, or recent illness.    OB History    Gravida  6   Para  2   Term  1   Preterm  1   AB  3   Living  2     SAB  2   TAB  1   Ectopic      Multiple      Live Births  2           Past Medical History:  Diagnosis Date  . Anemia   . Anxiety   . Asthma   . Chlamydia   . Depression   . Headache   . Hypoglycemia    with preg  . Infection    UTI    Past Surgical History:  Procedure Laterality Date  . abortion    . DILATION AND CURETTAGE OF UTERUS    . NO PAST SURGERIES      Family History  Problem Relation Age of Onset  . Diabetes Mother   . Hypertension Mother   . Aneurysm Mother   . Diabetes Father   . Hypertension Father     Social History   Tobacco Use  . Smoking status: Current Some Day Smoker    Packs/day: 0.25    Years: 15.00    Pack years: 3.75    Types: Cigarettes  .  Smokeless tobacco: Never Used  Substance Use Topics  . Alcohol use: Never  . Drug use: Never    Allergies:  Allergies  Allergen Reactions  . Chocolate Hives  . Tylenol [Acetaminophen] Hives    No medications prior to admission.    Review of Systems  Constitutional: Negative for fever.  Respiratory: Negative for shortness of breath.   Gastrointestinal: Positive for abdominal pain. Negative for nausea and vomiting.  Genitourinary: Negative for difficulty urinating, dysuria, flank pain, vaginal bleeding, vaginal discharge and vaginal pain.  Musculoskeletal: Positive for back pain.  Neurological: Negative for headaches.  All other systems reviewed and are negative.  Physical Exam   Blood pressure (!) 102/56, pulse 78, temperature 98.3 F (36.8 C), resp. rate 16, last menstrual period 06/30/2018, SpO2 100 %.  Physical Exam  Nursing note and vitals reviewed. Constitutional: She is oriented to person, place, and time. She appears well-developed and well-nourished.  Cardiovascular: Normal rate.  Respiratory: Effort normal and breath sounds normal.  GI: Soft. She exhibits no distension. There  is no abdominal tenderness. There is no rebound and no guarding.  Genitourinary:    No vaginal discharge.   Neurological: She is alert and oriented to person, place, and time.  Skin: Skin is warm and dry.  Psychiatric: She has a normal mood and affect. Her behavior is normal. Judgment and thought content normal.   Cervix visually closed on SSE. Unable to collect FFN due to recent intercourse. Confirmed closed cervix with digital exam MAU Course/MDM  Procedures: sterile speculum exam  --Baseline 135, moderate variability, positive accels, no decels --Toco: UI --Hx preterm delivery. Not on 17-P this pregnancy --Patient declines IV fluid bolus.  --Patient coping well with 9/10 pain via Christmas movie on television, texting on phone.  --Pain score improving from 9/10 to 6/10 after Flexeril  and two pitchers of water. Denies pain during CNM's final visit to bedside to discuss results. Requests discharge and outpatient rx Flexeril --Discussed with patient that her report of low back pain and round ligament pain are WNL for third trimester. Advised discussion with [facility] provider about maternity belt, repositioning and gentle stretching. Patient noted to be slouching at bottom of triage bed throughout evaluation in MAU. Discussed that this alone could cause low back pain.  Patient Vitals for the past 24 hrs:  BP Temp Pulse Resp SpO2  03/09/19 1838 (!) 102/56 -- 78 -- --  03/09/19 1605 109/64 -- (!) 102 -- 100 %  03/09/19 1554 -- 98.3 F (36.8 C) -- -- 100 %  03/09/19 1540 128/89 -- (!) 104 16 --   Results for orders placed or performed during the hospital encounter of 03/09/19 (from the past 24 hour(s))  Urinalysis, Routine w reflex microscopic     Status: Abnormal   Collection Time: 03/09/19  3:54 PM  Result Value Ref Range   Color, Urine YELLOW YELLOW   APPearance HAZY (A) CLEAR   Specific Gravity, Urine 1.026 1.005 - 1.030   pH 6.0 5.0 - 8.0   Glucose, UA NEGATIVE NEGATIVE mg/dL   Hgb urine dipstick NEGATIVE NEGATIVE   Bilirubin Urine NEGATIVE NEGATIVE   Ketones, ur NEGATIVE NEGATIVE mg/dL   Protein, ur 30 (A) NEGATIVE mg/dL   Nitrite NEGATIVE NEGATIVE   Leukocytes,Ua SMALL (A) NEGATIVE   RBC / HPF 0-5 0 - 5 RBC/hpf   WBC, UA 0-5 0 - 5 WBC/hpf   Bacteria, UA FEW (A) NONE SEEN   Squamous Epithelial / LPF 6-10 0 - 5   Mucus PRESENT   Wet prep, genital     Status: Abnormal   Collection Time: 03/09/19  4:54 PM   Specimen: PATH Cytology Cervicovaginal Ancillary Only  Result Value Ref Range   Yeast Wet Prep HPF POC NONE SEEN NONE SEEN   Trich, Wet Prep NONE SEEN NONE SEEN   Clue Cells Wet Prep HPF POC NONE SEEN NONE SEEN   WBC, Wet Prep HPF POC MANY (A) NONE SEEN   Sperm NONE SEEN     Assessment and Plan  --28 y.o. [id number] at [redacted]w[redacted]d  --Reactive tracing, very rare  contractions, closed cervix --Round ligament pain, pubic symphysis pain --Discharge home in stable condition  F/U: Patient is establishing care with Dr. Cherly Hensen. First appointment 03/14/19  Calvert Cantor, CNM 03/09/2019, 8:08 PM

## 2019-03-09 NOTE — MAU Note (Signed)
Pt states she has felt no fetal movement since yesterday morning.  Pt reports chronic heartburn.   Pt reports severe constant lower back pain that started 3 weeks ago. Pt is concerned because this is a similar feeling she had when she delivered her last baby at 21 weeks.   Denies LOF or vaginal bleeding.

## 2019-03-13 LAB — GC/CHLAMYDIA PROBE AMP (~~LOC~~) NOT AT ARMC
Chlamydia: POSITIVE — AB
Comment: NEGATIVE
Comment: NORMAL
Neisseria Gonorrhea: NEGATIVE

## 2019-03-14 ENCOUNTER — Telehealth: Payer: Self-pay | Admitting: Advanced Practice Midwife

## 2019-03-14 NOTE — Telephone Encounter (Signed)
Attempted to call to discuss + Chlamydia. Phone rang, no opportunity to leave voicemail.  Mallie Snooks, MSN, CNM Certified Nurse Midwife, [employer] for [employer], [facility] [employer] 03/14/19 3:12 PM

## 2019-03-19 ENCOUNTER — Telehealth (HOSPITAL_COMMUNITY): Payer: Self-pay

## 2019-04-09 ENCOUNTER — Other Ambulatory Visit: Payer: Self-pay

## 2019-04-09 ENCOUNTER — Inpatient Hospital Stay (HOSPITAL_COMMUNITY)
Admission: AD | Admit: 2019-04-09 | Discharge: 2019-04-09 | Disposition: A | Discharge status: Home / Self Care | Payer: Medicaid Other | Attending: Obstetrics and Gynecology | Admitting: Obstetrics and Gynecology

## 2019-04-09 ENCOUNTER — Encounter (HOSPITAL_COMMUNITY): Payer: Self-pay | Admitting: Obstetrics and Gynecology

## 2019-04-09 DIAGNOSIS — O368130 Decreased fetal movements, third trimester, not applicable or unspecified: Secondary | ICD-10-CM | POA: Diagnosis not present

## 2019-04-09 DIAGNOSIS — O0933 Supervision of pregnancy with insufficient antenatal care, third trimester: Secondary | ICD-10-CM

## 2019-04-09 DIAGNOSIS — Z3A37 37 weeks gestation of pregnancy: Secondary | ICD-10-CM | POA: Diagnosis not present

## 2019-04-09 DIAGNOSIS — O99333 Smoking (tobacco) complicating pregnancy, third trimester: Secondary | ICD-10-CM | POA: Diagnosis not present

## 2019-04-09 DIAGNOSIS — Z3689 Encounter for other specified antenatal screening: Secondary | ICD-10-CM

## 2019-04-09 DIAGNOSIS — Z886 Allergy status to analgesic agent status: Secondary | ICD-10-CM | POA: Insufficient documentation

## 2019-04-09 DIAGNOSIS — F17210 Nicotine dependence, cigarettes, uncomplicated: Secondary | ICD-10-CM | POA: Diagnosis not present

## 2019-04-09 DIAGNOSIS — Z0371 Encounter for suspected problem with amniotic cavity and membrane ruled out: Secondary | ICD-10-CM | POA: Diagnosis not present

## 2019-04-09 LAB — HEMOGLOBIN A1C
Hgb A1c MFr Bld: 5.5 % (ref 4.8–5.6)
Mean Plasma Glucose: 111.15 mg/dL

## 2019-04-09 LAB — HIV ANTIBODY (ROUTINE TESTING W REFLEX): HIV Screen 4th Generation wRfx: NONREACTIVE

## 2019-04-09 LAB — URINALYSIS, ROUTINE W REFLEX MICROSCOPIC
Bilirubin Urine: NEGATIVE
Glucose, UA: NEGATIVE mg/dL
Hgb urine dipstick: NEGATIVE
Ketones, ur: NEGATIVE mg/dL
Nitrite: NEGATIVE
Protein, ur: NEGATIVE mg/dL
Specific Gravity, Urine: 1.001 — ABNORMAL LOW (ref 1.005–1.030)
pH: 6 (ref 5.0–8.0)

## 2019-04-09 LAB — CBC
HCT: 35.9 % — ABNORMAL LOW (ref 36.0–46.0)
Hemoglobin: 11.8 g/dL — ABNORMAL LOW (ref 12.0–15.0)
MCH: 30 pg (ref 26.0–34.0)
MCHC: 32.9 g/dL (ref 30.0–36.0)
MCV: 91.3 fL (ref 80.0–100.0)
Platelets: 282 10*3/uL (ref 150–400)
RBC: 3.93 MIL/uL (ref 3.87–5.11)
RDW: 13.7 % (ref 11.5–15.5)
WBC: 11.3 10*3/uL — ABNORMAL HIGH (ref 4.0–10.5)
nRBC: 0 % (ref 0.0–0.2)

## 2019-04-09 LAB — HEPATITIS B SURFACE ANTIGEN: Hepatitis B Surface Ag: NONREACTIVE

## 2019-04-09 LAB — POCT FERN TEST: POCT Fern Test: NEGATIVE

## 2019-04-09 NOTE — MAU Note (Signed)
Kathleen Curtis is a 29 y.o. at [redacted]w[redacted]d here in MAU reporting:  +decreased fetal movement Last felt movement 1.5 days ago +nausea +LOF? States she wakes up and their is enough fluid she has to change clothes. Not wearing a pad Pain score: denies Vitals:   04/09/19 1349  BP: 115/75  Pulse: (!) 111  Resp: 15  Temp: (!) 97.5 F (36.4 C)  SpO2: 97%     FHT: 150 via efm Lab orders placed from triage: ua

## 2019-04-09 NOTE — Discharge Instructions (Signed)
Fetal Movement Counts Patient Name: ________________________________________________ Patient Due Date: ____________________ What is a fetal movement count?  A fetal movement count is the number of times that you feel your baby move during a certain amount of time. This may also be called a fetal kick count. A fetal movement count is recommended for every pregnant woman. You may be asked to start counting fetal movements as early as week 28 of your pregnancy. Pay attention to when your baby is most active. You may notice your baby's sleep and wake cycles. You may also notice things that make your baby move more. You should do a fetal movement count:  When your baby is normally most active.  At the same time each day. A good time to count movements is while you are resting, after having something to eat and drink. How do I count fetal movements? 1. Find a quiet, comfortable area. Sit, or lie down on your side. 2. Write down the date, the start time and stop time, and the number of movements that you felt between those two times. Take this information with you to your health care visits. 3. Write down your start time when you feel the first movement. 4. Count kicks, flutters, swishes, rolls, and jabs. You should feel at least 10 movements. 5. You may stop counting after you have felt 10 movements, or if you have been counting for 2 hours. Write down the stop time. 6. If you do not feel 10 movements in 2 hours, contact your health care provider for further instructions. Your health care provider may want to do additional tests to assess your baby's well-being. Contact a health care provider if:  You feel fewer than 10 movements in 2 hours.  Your baby is not moving like he or she usually does. Date: ____________ Start time: ____________ Stop time: ____________ Movements: ____________ Date: ____________ Start time: ____________ Stop time: ____________ Movements: ____________ Date: ____________  Start time: ____________ Stop time: ____________ Movements: ____________ Date: ____________ Start time: ____________ Stop time: ____________ Movements: ____________ Date: ____________ Start time: ____________ Stop time: ____________ Movements: ____________ Date: ____________ Start time: ____________ Stop time: ____________ Movements: ____________ Date: ____________ Start time: ____________ Stop time: ____________ Movements: ____________ Date: ____________ Start time: ____________ Stop time: ____________ Movements: ____________ Date: ____________ Start time: ____________ Stop time: ____________ Movements: ____________ This information is not intended to replace advice given to you by your health care provider. Make sure you discuss any questions you have with your health care provider. Document Revised: 11/03/2018 Document Reviewed: 11/03/2018 Elsevier Patient Education  2020 Elsevier Inc.        Signs and Symptoms of Labor Labor is your body's natural process of moving your baby, placenta, and umbilical cord out of your uterus. The process of labor usually starts when your baby is full-term, between 37 and 40 weeks of pregnancy. How will I know when I am close to going into labor? As your body prepares for labor and the birth of your baby, you may notice the following symptoms in the weeks and days before true labor starts:  Having a strong desire to get your home ready to receive your new baby. This is called nesting. Nesting may be a sign that labor is approaching, and it may occur several weeks before birth. Nesting may involve cleaning and organizing your home.  Passing a small amount of thick, bloody mucus out of your vagina (normal bloody show or losing your mucus plug). This may happen more than a   week before labor begins, or it might occur right before labor begins as the opening of the cervix starts to widen (dilate). For some women, the entire mucus plug passes at once. For others,  smaller portions of the mucus plug may gradually pass over several days.  Your baby moving (dropping) lower in your pelvis to get into position for birth (lightening). When this happens, you may feel more pressure on your bladder and pelvic bone and less pressure on your ribs. This may make it easier to breathe. It may also cause you to need to urinate more often and have problems with bowel movements.  Having "practice contractions" (Braxton Hicks contractions) that occur at irregular (unevenly spaced) intervals that are more than 10 minutes apart. This is also called false labor. False labor contractions are common after exercise or sexual activity, and they will stop if you change position, rest, or drink fluids. These contractions are usually mild and do not get stronger over time. They may feel like: ? A backache or back pain. ? Mild cramps, similar to menstrual cramps. ? Tightening or pressure in your abdomen. Other early symptoms that labor may be starting soon include:  Nausea or loss of appetite.  Diarrhea.  Having a sudden burst of energy, or feeling very tired.  Mood changes.  Having trouble sleeping. How will I know when labor has begun? Signs that true labor has begun may include:  Having contractions that come at regular (evenly spaced) intervals and increase in intensity. This may feel like more intense tightening or pressure in your abdomen that moves to your back. ? Contractions may also feel like rhythmic pain in your upper thighs or back that comes and goes at regular intervals. ? For first-time mothers, this change in intensity of contractions often occurs at a more gradual pace. ? Women who have given birth before may notice a more rapid progression of contraction changes.  Having a feeling of pressure in the vaginal area.  Your water breaking (rupture of membranes). This is when the sac of fluid that surrounds your baby breaks. When this happens, you will notice  fluid leaking from your vagina. This may be clear or blood-tinged. Labor usually starts within 24 hours of your water breaking, but it may take longer to begin. ? Some women notice this as a gush of fluid. ? Others notice that their underwear repeatedly becomes damp. Follow these instructions at home:   When labor starts, or if your water breaks, call your health care provider or nurse care line. Based on your situation, they will determine when you should go in for an exam.  When you are in early labor, you may be able to rest and manage symptoms at home. Some strategies to try at home include: ? Breathing and relaxation techniques. ? Taking a warm bath or shower. ? Listening to music. ? Using a heating pad on the lower back for pain. If you are directed to use heat:  Place a towel between your skin and the heat source.  Leave the heat on for 20-30 minutes.  Remove the heat if your skin turns bright red. This is especially important if you are unable to feel pain, heat, or cold. You may have a greater risk of getting burned. Get help right away if:  You have painful, regular contractions that are 5 minutes apart or less.  Labor starts before you are [redacted] weeks along in your pregnancy.  You have a fever.  You have   a headache that does not go away.  You have bright red blood coming from your vagina.  You do not feel your baby moving.  You have a sudden onset of: ? Severe headache with vision problems. ? Nausea, vomiting, or diarrhea. ? Chest pain or shortness of breath. These symptoms may be an emergency. If your health care provider recommends that you go to the hospital or birth center where you plan to deliver, do not drive yourself. Have someone else drive you, or call emergency services (911 in the [place] Summary  Labor is your body's natural process of moving your baby, placenta, and umbilical cord out of your uterus.  The process of labor usually starts when your baby is  full-term, between 37 and 40 weeks of pregnancy.  When labor starts, or if your water breaks, call your health care provider or nurse care line. Based on your situation, they will determine when you should go in for an exam. This information is not intended to replace advice given to you by your health care provider. Make sure you discuss any questions you have with your health care provider. Document Revised: 12/14/2016 Document Reviewed: 08/21/2016 Elsevier Patient Education  2020 Elsevier Inc.  

## 2019-04-09 NOTE — MAU Provider Note (Signed)
Chief Complaint:  Rupture of Membranes and Decreased Fetal Movement   First Provider Initiated Contact with Patient 04/09/19 1410     HPI: Kathleen Curtis is a 29 y.o. [id number] at [redacted]w[redacted]d who presents to maternity admissions reporting leaking fluid and decreased fetal movement. States she noticed fluid in her underwear. Appears clear. No odor. No irritation. No bleeding. Denies contractions.  Reports decreased fetal movement. States the movement has changed and is not as strong. Since being on the monitor for the last 10 minutes she has felt baby move twice.   States she was receiving prenatal care in [place] until October when her ob/gyn told her to stop making the drive up there; has not found a local ob/gyn since then. Was going to Dr. Lavell Islam in [place], [place].  Reports a 35 wk vaginal delivery due to PPROM & a full term delivery.   Was diagnosed with chlamydia during her last MAU visit on 12/22. States the health department got in touch with her and she received treatment there but is unsure of the date. Has not had intercourse since being treated.    Past Medical History:  Diagnosis Date  . Anemia   . Anxiety   . Asthma   . Chlamydia   . Depression   . Headache   . Hypoglycemia    with preg  . Infection    UTI   OB History  Gravida Para Term Preterm AB Living  6 2 1 1 3 2   SAB TAB Ectopic Multiple Live Births  2 1     2     # Outcome Date GA Lbr Len/2nd Weight Sex Delivery Anes PTL Lv  6 Current           5 TAB 2019          4 SAB 2011          3 SAB 2010          2 Preterm  [redacted]w[redacted]d   M Vag-Spont  Y LIV  1 Term     F Vag-Spont  Y LIV   Past Surgical History:  Procedure Laterality Date  . DILATION AND CURETTAGE OF UTERUS     Family History  Problem Relation Age of Onset  . Diabetes Mother   . Hypertension Mother   . Aneurysm Mother   . Diabetes Father   . Hypertension Father    Social History   Tobacco Use  . Smoking status: Current Some Day Smoker    Packs/day: 0.25     Years: 15.00    Pack years: 3.75    Types: Cigarettes  . Smokeless tobacco: Never Used  Substance Use Topics  . Alcohol use: Never  . Drug use: Never   Allergies  Allergen Reactions  . Chocolate Hives  . Tylenol [Acetaminophen] Hives   No medications prior to admission.    I have reviewed patient's Past Medical Hx, Surgical Hx, Family Hx, Social Hx, medications and allergies.   ROS:  Review of Systems  Constitutional: Negative.   Gastrointestinal: Negative.   Genitourinary: Positive for vaginal discharge.    Physical Exam   Patient Vitals for the past 24 hrs:  BP Temp Temp src Pulse Resp SpO2  04/09/19 1623 101/65 -- -- -- 16 --  04/09/19 1349 115/75 (!) 97.5 F (36.4 C) Oral (!) 111 15 97 %    Constitutional: Well-developed, well-nourished female in no acute distress.  Cardiovascular: normal rate & rhythm, no murmur Respiratory: normal effort, lung sounds  clear throughout GI: Abd soft, non-tender, gravid appropriate for gestational age. Pos BS x 4 MS: Extremities nontender, no edema, normal ROM Neurologic: Alert and oriented x 4.  GU:   SSE performed. No pooling of fluids. No blood. Cervix visually closed.   NST:  Baseline: 135 bpm, Variability: Good {> 6 bpm), Accelerations: Reactive and Decelerations: Absent   Labs: Results for orders placed or performed during the hospital encounter of 04/09/19 (from the past 24 hour(s))  Urinalysis, Routine w reflex microscopic     Status: Abnormal   Collection Time: 04/09/19  2:12 PM  Result Value Ref Range   Color, Urine STRAW (A) YELLOW   APPearance HAZY (A) CLEAR   Specific Gravity, Urine 1.001 (L) 1.005 - 1.030   pH 6.0 5.0 - 8.0   Glucose, UA NEGATIVE NEGATIVE mg/dL   Hgb urine dipstick NEGATIVE NEGATIVE   Bilirubin Urine NEGATIVE NEGATIVE   Ketones, ur NEGATIVE NEGATIVE mg/dL   Protein, ur NEGATIVE NEGATIVE mg/dL   Nitrite NEGATIVE NEGATIVE   Leukocytes,Ua MODERATE (A) NEGATIVE   WBC, UA 0-5 0 - 5 WBC/hpf    Bacteria, UA RARE (A) NONE SEEN   Squamous Epithelial / LPF 0-5 0 - 5  Fern Test     Status: None   Collection Time: 04/09/19  2:39 PM  Result Value Ref Range   POCT Fern Test Negative = intact amniotic membranes   Hemoglobin A1c     Status: None   Collection Time: 04/09/19  4:00 PM  Result Value Ref Range   Hgb A1c MFr Bld 5.5 4.8 - 5.6 %   Mean Plasma Glucose 111.15 mg/dL  CBC     Status: Abnormal   Collection Time: 04/09/19  4:00 PM  Result Value Ref Range   WBC 11.3 (H) 4.0 - 10.5 K/uL   RBC 3.93 3.87 - 5.11 MIL/uL   Hemoglobin 11.8 (L) 12.0 - 15.0 g/dL   HCT 18.8 (L) 41.6 - 60.6 %   MCV 91.3 80.0 - 100.0 fL   MCH 30.0 26.0 - 34.0 pg   MCHC 32.9 30.0 - 36.0 g/dL   RDW 30.1 60.1 - 09.3 %   Platelets 282 150 - 400 K/uL   nRBC 0.0 0.0 - 0.2 %    Imaging:  No results found.  MAU Course: Orders Placed This Encounter  Procedures  . Culture, beta strep (group b only)  . Culture, OB Urine  . [place] MFM OB COMP + 14 WK  . Urinalysis, Routine w reflex microscopic  . Hemoglobin A1c  . CBC  . RPR  . HIV antibody  . Rubella screen  . Hepatitis B surface antigen  . Fern Test  . Discharge patient   No orders of the defined types were placed in this encounter.   MDM: Category 1 tracing with fetal movement noted  SSE performed. No pooling of fluid & fern negative  GBS culture collected. Prenatal labs ordered.  Will order outpatient anatomy ultrasound.   Pt reports that her due date was 1/6. Discussed with her that per ultrasounds in our system at 10 weeks & 17 weeks, her due date is 1/29. She is agreeable with this.   Pt unsure when she was treated for chlamydia. Has not had intercourse since treatment. TOC collected today.   Assessment: 1. Encounter for suspected PROM, with rupture of membranes not found   2. [redacted] weeks gestation of pregnancy   3. Decreased fetal movements in third trimester, single or unspecified fetus  4. NST (non-stress test) reactive   5. Limited  prenatal care in third trimester     Plan: Discharge home in stable condition.  Labor precautions and fetal kick counts  OB labs pending Outpatient anatomy ultrasound ordered Msg to [facility] for prenatal care GC/CT & urine culture pending  Follow-up [place] Follow up.   Specialty: Maternal and Fetal Medicine Why: The office will call you to schedule your anatomy ultrasound Contact information: [address] [place], [place] [id number] [place] [id number] [place] [facility] Follow up.   Specialty: Obstetrics and Gynecology Why: the office will call you to schedule a follow up appointment Contact information: [address] [place], [place] [id number] [place] [id number] [phone]          Allergies as of 04/09/2019      Reactions   Chocolate Hives   Tylenol [acetaminophen] Hives      Medication List    TAKE these medications   cyclobenzaprine 10 MG tablet Commonly known as: FLEXERIL Take 1 tablet (10 mg total) by mouth 2 (two) times daily as needed for muscle spasms.   PrePLUS 27-1 MG Tabs Take 1 tablet by mouth daily.       Jorje Guild, NP 04/09/2019 4:43 PM

## 2019-04-10 ENCOUNTER — Encounter: Payer: Medicaid Other | Admitting: Student

## 2019-04-10 ENCOUNTER — Telehealth: Payer: Self-pay | Admitting: Student

## 2019-04-10 LAB — GC/CHLAMYDIA PROBE AMP (~~LOC~~) NOT AT ARMC
Chlamydia: NEGATIVE
Comment: NEGATIVE
Comment: NORMAL
Neisseria Gonorrhea: NEGATIVE

## 2019-04-10 LAB — CULTURE, OB URINE

## 2019-04-10 LAB — RPR: RPR Ser Ql: NONREACTIVE

## 2019-04-10 LAB — RUBELLA SCREEN: Rubella: 2.81 index (ref >0.99)

## 2019-04-10 NOTE — Telephone Encounter (Signed)
Per the in basket message request, the patient is scheduled for mychart visits until the 40+1 day. The patient is a [place] ob [place] different [place]. No OB care since October. Appointment reminders mailed to the patient.

## 2019-04-12 LAB — CULTURE, BETA STREP (GROUP B ONLY)

## 2019-04-19 ENCOUNTER — Other Ambulatory Visit: Payer: Self-pay

## 2019-04-19 ENCOUNTER — Encounter: Payer: Self-pay | Admitting: Obstetrics and Gynecology

## 2019-04-19 ENCOUNTER — Inpatient Hospital Stay (HOSPITAL_COMMUNITY)
Admission: AD | Admit: 2019-04-19 | Discharge: 2019-04-19 | Disposition: A | Discharge status: Home / Self Care | Payer: Medicaid Other | Attending: Family Medicine | Admitting: Family Medicine

## 2019-04-19 ENCOUNTER — Encounter (HOSPITAL_COMMUNITY): Payer: Self-pay | Admitting: Family Medicine

## 2019-04-19 ENCOUNTER — Telehealth (INDEPENDENT_AMBULATORY_CARE_PROVIDER_SITE_OTHER): Payer: Medicaid Other | Admitting: Obstetrics and Gynecology

## 2019-04-19 DIAGNOSIS — O26893 Other specified pregnancy related conditions, third trimester: Secondary | ICD-10-CM | POA: Insufficient documentation

## 2019-04-19 DIAGNOSIS — O99333 Smoking (tobacco) complicating pregnancy, third trimester: Secondary | ICD-10-CM | POA: Diagnosis not present

## 2019-04-19 DIAGNOSIS — O99013 Anemia complicating pregnancy, third trimester: Secondary | ICD-10-CM | POA: Diagnosis not present

## 2019-04-19 DIAGNOSIS — R12 Heartburn: Secondary | ICD-10-CM | POA: Insufficient documentation

## 2019-04-19 DIAGNOSIS — O0993 Supervision of high risk pregnancy, unspecified, third trimester: Secondary | ICD-10-CM

## 2019-04-19 DIAGNOSIS — O368130 Decreased fetal movements, third trimester, not applicable or unspecified: Secondary | ICD-10-CM

## 2019-04-19 DIAGNOSIS — J45909 Unspecified asthma, uncomplicated: Secondary | ICD-10-CM | POA: Insufficient documentation

## 2019-04-19 DIAGNOSIS — D649 Anemia, unspecified: Secondary | ICD-10-CM | POA: Insufficient documentation

## 2019-04-19 DIAGNOSIS — Z79899 Other long term (current) drug therapy: Secondary | ICD-10-CM | POA: Insufficient documentation

## 2019-04-19 DIAGNOSIS — Z3A38 38 weeks gestation of pregnancy: Secondary | ICD-10-CM | POA: Insufficient documentation

## 2019-04-19 DIAGNOSIS — O368131 Decreased fetal movements, third trimester, fetus 1: Secondary | ICD-10-CM

## 2019-04-19 DIAGNOSIS — R519 Headache, unspecified: Secondary | ICD-10-CM | POA: Diagnosis not present

## 2019-04-19 DIAGNOSIS — E162 Hypoglycemia, unspecified: Secondary | ICD-10-CM

## 2019-04-19 DIAGNOSIS — J4520 Mild intermittent asthma, uncomplicated: Secondary | ICD-10-CM

## 2019-04-19 DIAGNOSIS — O99513 Diseases of the respiratory system complicating pregnancy, third trimester: Secondary | ICD-10-CM | POA: Diagnosis not present

## 2019-04-19 DIAGNOSIS — O98813 Other maternal infectious and parasitic diseases complicating pregnancy, third trimester: Secondary | ICD-10-CM

## 2019-04-19 DIAGNOSIS — O0990 Supervision of high risk pregnancy, unspecified, unspecified trimester: Secondary | ICD-10-CM | POA: Insufficient documentation

## 2019-04-19 DIAGNOSIS — F17210 Nicotine dependence, cigarettes, uncomplicated: Secondary | ICD-10-CM | POA: Insufficient documentation

## 2019-04-19 DIAGNOSIS — A749 Chlamydial infection, unspecified: Secondary | ICD-10-CM

## 2019-04-19 DIAGNOSIS — O0933 Supervision of pregnancy with insufficient antenatal care, third trimester: Secondary | ICD-10-CM

## 2019-04-19 DIAGNOSIS — O0930 Supervision of pregnancy with insufficient antenatal care, unspecified trimester: Secondary | ICD-10-CM | POA: Insufficient documentation

## 2019-04-19 LAB — URINALYSIS, ROUTINE W REFLEX MICROSCOPIC
Bilirubin Urine: NEGATIVE
Glucose, UA: NEGATIVE mg/dL
Hgb urine dipstick: NEGATIVE
Ketones, ur: NEGATIVE mg/dL
Nitrite: NEGATIVE
Protein, ur: NEGATIVE mg/dL
Specific Gravity, Urine: 1.004 — ABNORMAL LOW (ref 1.005–1.030)
pH: 7 (ref 5.0–8.0)

## 2019-04-19 MED ORDER — PANTOPRAZOLE SODIUM 20 MG PO TBEC
20.0000 mg | DELAYED_RELEASE_TABLET | Freq: Once | ORAL | Status: AC
  Administered 2019-04-19: 20 mg via ORAL
  Filled 2019-04-19: qty 1

## 2019-04-19 MED ORDER — ALUM & MAG HYDROXIDE-SIMETH 200-200-20 MG/5ML PO SUSP
15.0000 mL | Freq: Once | ORAL | Status: AC
  Administered 2019-04-19: 30 mL via ORAL
  Filled 2019-04-19: qty 30

## 2019-04-19 MED ORDER — ALUM & MAG HYDROXIDE-SIMETH 200-200-20 MG/5ML PO SUSP
30.0000 mL | Freq: Once | ORAL | Status: AC
  Administered 2019-04-19: 30 mL via ORAL

## 2019-04-19 MED ORDER — METOCLOPRAMIDE HCL 10 MG PO TABS
10.0000 mg | ORAL_TABLET | Freq: Once | ORAL | Status: AC
  Administered 2019-04-19: 10 mg via ORAL
  Filled 2019-04-19: qty 1

## 2019-04-19 MED ORDER — ALUM & MAG HYDROXIDE-SIMETH 200-200-20 MG/5ML PO SUSP: 30.0000 mL | Freq: Four times a day (QID) | ORAL | 0 refills | Status: AC | PRN

## 2019-04-19 MED ORDER — METOCLOPRAMIDE HCL 10 MG PO TABS: 10.0000 mg | ORAL_TABLET | Freq: Three times a day (TID) | ORAL | 0 refills | Status: AC

## 2019-04-19 NOTE — Progress Notes (Signed)
TELEHEALTH OBSTETRICS INITIAL PRENATAL VIRTUAL VIDEO VISIT ENCOUNTER NOTE  Provider location: Center for [facility] [place]   I connected with Candy Sledge on 04/19/19 at  1:35 PM EST by MyChart Video Encounter at home and verified that I am speaking with the correct person using two identifiers.   I discussed the limitations, risks, security and privacy concerns of performing an evaluation and management service virtually and the availability of in person appointments. I also discussed with the patient that there may be a patient responsible charge related to this service. The patient expressed understanding and agreed to proceed. Subjective:  Kathleen Curtis is a 29 y.o. [id number] at [redacted]w[redacted]d being seen today for ongoing prenatal care.  She is currently monitored for the following issues for this high-risk pregnancy and has Chlamydia infection affecting pregnancy; Supervision of high risk pregnancy, antepartum; Hypoglycemia; Limited prenatal care; and Asthma on their problem list.  Patient reports decreased fetal movement, headache and blurry vision. Has had headache for about a week, feels like a constant pounding. Taken flexeril with no improvement. Cannot take tylenol. Contractions: Irregular. Vag. Bleeding: None.  Movement: Absent. Denies any leaking of fluid.   The following portions of the patient's history were reviewed and updated as appropriate: allergies, current medications, past family history, past medical history, past social history, past surgical history and problem list.   Past Medical History:  Diagnosis Date  . Anemia   . Anxiety   . Asthma   . Chlamydia   . Depression   . Headache   . Hypoglycemia    with preg  . Infection    UTI   Past Surgical History:  Procedure Laterality Date  . DILATION AND CURETTAGE OF UTERUS     OB History    Gravida  6   Para  2   Term  1   Preterm  1   AB  3   Living  2     SAB  2   TAB  1   Ectopic      Multiple      Live Births  2          Current Outpatient Medications on File Prior to Visit  Medication Sig Dispense Refill  . cyclobenzaprine (FLEXERIL) 10 MG tablet Take 1 tablet (10 mg total) by mouth 2 (two) times daily as needed for muscle spasms. 20 tablet 0  . fluticasone (FLOVENT HFA) 110 MCG/ACT inhaler Inhale 2 puffs into the lungs 2 (two) times daily.    . Prenatal Vit-Fe Fumarate-FA (PREPLUS) 27-1 MG TABS Take 1 tablet by mouth daily. 30 tablet 13   No current facility-administered medications on file prior to visit.   Objective:  There were no vitals filed for this visit.  Fetal Status:     Movement: Absent     General:  Alert, oriented and cooperative. Patient is in no acute distress.  Respiratory: Normal respiratory effort, no problems with respiration noted  Mental Status: Normal mood and affect. Normal behavior. Normal judgment and thought content.  Rest of physical exam deferred due to type of encounter  Imaging: No results found.  Assessment and Plan:  Pregnancy: [id number] at 103w5d  1. Supervision of high risk pregnancy, antepartum - Did not have GTT - One limited 17 week [place] here gives EDC was 04/28/19 - 10 week [place] here gives EDC as 04/28/19 - Reports 1st trim [place] in [place] of 04/05/19, which lined up with last period - need to obtain records  from [facility] in [place]  2. Chlamydia infection affecting pregnancy in third trimester - Needs TOC  3. Hypoglycemia Manages with small snacks  4. Limited prenatal care in third trimester - had care with OB/GYN in [place], has not been seen since October  5. Mild intermittent asthma without complication Well controlled Has not had an attack in a while  To MAU for headache and no fetal movement, patient agreeable. MAU staff aware.   Term labor symptoms and general obstetric precautions including but not limited to vaginal bleeding, contractions, leaking of fluid and fetal movement were reviewed in detail with the  patient. I discussed the assessment and treatment plan with the patient. The patient was provided an opportunity to ask questions and all were answered. The patient agreed with the plan and demonstrated an understanding of the instructions. The patient was advised to call back or seek an in-person office evaluation/go to MAU at [facility] for any urgent or concerning symptoms. Please refer to After Visit Summary for other counseling recommendations.   I provided 20 minutes of face-to-face time during this encounter.  Return in about 1 week (around 04/26/2019) for high OB, virtual.  Future Appointments  Date Time Provider [place]  04/21/2019 12:30 PM [place] 1 WH-MFCUS MFC-US  04/24/2019  1:55 PM Sloan Leiter, MD [place]  05/01/2019  2:15 PM [place], [place], [place] [facility], [place]

## 2019-04-19 NOTE — MAU Note (Signed)
Pt states that she has had a headache for 1-1.5 weeks. Pt rating headache 6/10.   Pt reports that she also has severe chronic heartburn and is experiencing discomfort now.   Pt reports swelling in her feet that started a couple of weeks ago that has worsened recently.   Denies vaginal bleeding, Denies LOF.   Pt reports no fetal movement since 0100.

## 2019-04-19 NOTE — Progress Notes (Signed)
I connected with  Kathleen Curtis on 04/19/19 at 1317 by telephone and verified that I am speaking with the correct person using two identifiers.   I discussed the limitations, risks, security and privacy concerns of performing an evaluation and management service by telephone and the availability of in person appointments. I also discussed with the patient that there may be a patient responsible charge related to this service. The patient expressed understanding and agreed to proceed.  Pt does not have BP cuff at home. Pt denies blurry vision and dizziness, endorses headache. Reports headache began 1 week ago; pt has not tried anything for pain. Pt reports swelling in feet. Pt reports she has not felt baby move since 1am this morning.   Marjo Bicker, RN 04/19/2019  1:16 PM

## 2019-04-19 NOTE — MAU Provider Note (Addendum)
Chief Complaint:  Headache, Heartburn, and Decreased Fetal Movement   First Provider Initiated Contact with Patient 04/19/19 1539      HPI: Kathleen Curtis is a 29 y.o. [id number] at [redacted]w[redacted]d by 10 wk [place] who presents to maternity admissions after being seen virtually today by Dr. Earlene Plater. Patient was reporting headache and unable to take BP at home. Also reporting heartburn. Reports heartburn has been throughout pregnancy and she is taking Pepcid but it is not helping. Feels constant burning in throat. Has only eaten chips today. Patient also reports headache which is described as "all over her head" and constant for last week. She has not taken anything for this. She is allergic to Tylenol. Denies vision changes, dizziness, nausea/vomiting, RUQ pain, SOB, chest pain. Patient reports decreased fetal movement today but reports since being in MAU she is feeling baby move normally.  She denies ctx, LOF, vaginal bleeding, vaginal itching/burning/discharge, urinary symptoms or fever/chills.    Past Medical History: Past Medical History:  Diagnosis Date  . Anemia   . Anxiety   . Asthma   . Chlamydia   . Depression   . Headache   . Hypoglycemia    with preg  . Infection    UTI    Past obstetric history: OB History  Gravida Para Term Preterm AB Living  6 2 1 1 3 2   SAB TAB Ectopic Multiple Live Births  2 1     2     # Outcome Date GA Lbr Len/2nd Weight Sex Delivery Anes PTL Lv  6 Current           5 TAB 2019          4 SAB 2011          3 SAB 2010          2 Preterm  [redacted]w[redacted]d   M Vag-Spont  Y LIV  1 Term     F Vag-Spont  Y LIV    Past Surgical History: Past Surgical History:  Procedure Laterality Date  . DILATION AND CURETTAGE OF UTERUS      Family History: Family History  Problem Relation Age of Onset  . Diabetes Mother   . Hypertension Mother   . Aneurysm Mother   . Diabetes Father   . Hypertension Father     Social History: Social History   Tobacco Use  . Smoking status:  Current Some Day Smoker    Packs/day: 0.25    Years: 15.00    Pack years: 3.75    Types: Cigarettes  . Smokeless tobacco: Never Used  Substance Use Topics  . Alcohol use: Yes    Comment: pt states one glass of wine during pregnancy  . Drug use: Never    Allergies:  Allergies  Allergen Reactions  . Chocolate Hives  . Tylenol [Acetaminophen] Hives    Meds:  Medications Prior to Admission  Medication Sig Dispense Refill Last Dose  . cyclobenzaprine (FLEXERIL) 10 MG tablet Take 1 tablet (10 mg total) by mouth 2 (two) times daily as needed for muscle spasms. 20 tablet 0 Past Week at Unknown time  . fluticasone (FLOVENT HFA) 110 MCG/ACT inhaler Inhale 2 puffs into the lungs 2 (two) times daily.   04/19/2019 at Unknown time  . Prenatal Vit-Fe Fumarate-FA (PREPLUS) 27-1 MG TABS Take 1 tablet by mouth daily. 30 tablet 13 04/19/2019 at Unknown time    ROS:  Review of Systems All other systems negative unless noted above in HPI.  I have reviewed patient's Past Medical Hx, Surgical Hx, Family Hx, Social Hx, medications and allergies.   Physical Exam   Patient Vitals for the past 24 hrs:  BP Temp Pulse Resp SpO2 Weight  04/19/19 1640 115/82 -- 83 -- 99 % --  04/19/19 1538 113/75 -- 94 -- -- --  04/19/19 1531 127/86 -- 98 -- -- --  04/19/19 1529 127/86 97.9 F (36.6 C) 99 20 100 % --  04/19/19 1513 -- -- -- -- -- 90.5 kg   Constitutional: Well-developed, well-nourished female in no acute distress.  Cardiovascular: normal rate Respiratory: normal effort GI: Abd soft, non-tender, gravid appropriate for gestational age.  MS: Extremities nontender, no edema, normal ROM Neurologic: Alert and oriented x 4. CN 2-12 intact. GU: Neg CVAT. PELVIC EXAM: deferred BSUS: Vertex    FHT:  Baseline 130, moderate variability, accelerations present, no decelerations Contractions: None   Labs: Results for orders placed or performed during the hospital encounter of 04/19/19 (from the past 24  hour(s))  Urinalysis, Routine w reflex microscopic     Status: Abnormal   Collection Time: 04/19/19  4:09 PM  Result Value Ref Range   Color, Urine STRAW (A) YELLOW   APPearance CLEAR CLEAR   Specific Gravity, Urine 1.004 (L) 1.005 - 1.030   pH 7.0 5.0 - 8.0   Glucose, UA NEGATIVE NEGATIVE mg/dL   Hgb urine dipstick NEGATIVE NEGATIVE   Bilirubin Urine NEGATIVE NEGATIVE   Ketones, ur NEGATIVE NEGATIVE mg/dL   Protein, ur NEGATIVE NEGATIVE mg/dL   Nitrite NEGATIVE NEGATIVE   Leukocytes,Ua MODERATE (A) NEGATIVE   RBC / HPF 0-5 0 - 5 RBC/hpf   WBC, UA 0-5 0 - 5 WBC/hpf   Bacteria, UA RARE (A) NONE SEEN   Squamous Epithelial / LPF 0-5 0 - 5   Mucus PRESENT    --/--/A POS (07/07 0450)  Imaging:  No results found.  MAU Course/MDM: Orders Placed This Encounter  Procedures  . Culture, Urine  . Urinalysis, Routine w reflex microscopic  . Discharge patient Discharge disposition: 01-Home or Self Care; Discharge patient date: 04/19/2019    Meds ordered this encounter  Medications  . pantoprazole (PROTONIX) EC tablet 20 mg  . alum & mag hydroxide-simeth (MAALOX/MYLANTA) 200-200-20 MG/5ML suspension 15 mL  . metoCLOPramide (REGLAN) tablet 10 mg  . alum & mag hydroxide-simeth (MAALOX/MYLANTA) 200-200-20 MG/5ML suspension 30 mL  . metoCLOPramide (REGLAN) 10 MG tablet    Sig: Take 1 tablet (10 mg total) by mouth 3 (three) times daily with meals for 10 days.    Dispense:  30 tablet    Refill:  0  . alum & mag hydroxide-simeth (MYLANTA) 200-200-20 MG/5ML suspension    Sig: Take 30 mLs by mouth every 6 (six) hours as needed for indigestion or heartburn.    Dispense:  355 mL    Refill:  0     NST reviewed: Reactive without ctx Patient given Reglan, PPI, Mylanta for headache/heartburn. Headache and heartburn resolved completely. Fetal movement reported as normal for patient in MAU with reactive tracing. BP's all WNL.  Advised to keep upcoming prenatal appts. There was an MFM [place] scheduled  this Friday but patient reports she will be out of town; message sent to have them contact her for a new appt. Mylanta, Reglan prescribed for PRN use. UA with moderate leuks. Patient asymptomatic but culture sent.  Pt discharged with strict return precautions.  Assessment: 1. Pregnancy headache in third trimester   2. Decreased fetal movements  in third trimester, single or unspecified fetus   3. Heartburn during pregnancy in third trimester     Plan: Discharge home Labor precautions and fetal kick counts  Allergies as of 04/19/2019      Reactions   Chocolate Hives   Tylenol [acetaminophen] Hives      Medication List    TAKE these medications   alum & mag hydroxide-simeth 200-200-20 MG/5ML suspension Commonly known as: Mylanta Take 30 mLs by mouth every 6 (six) hours as needed for indigestion or heartburn.   cyclobenzaprine 10 MG tablet Commonly known as: FLEXERIL Take 1 tablet (10 mg total) by mouth 2 (two) times daily as needed for muscle spasms.   fluticasone 110 MCG/ACT inhaler Commonly known as: FLOVENT HFA Inhale 2 puffs into the lungs 2 (two) times daily.   metoCLOPramide 10 MG tablet Commonly known as: REGLAN Take 1 tablet (10 mg total) by mouth 3 (three) times daily with meals for 10 days.   PrePLUS 27-1 MG Tabs Take 1 tablet by mouth daily.       Barrington Ellison, MD [facility] Fellow, [facility] for [facility], [facility] 04/19/2019 4:50 PM

## 2019-04-20 LAB — URINE CULTURE

## 2019-04-21 ENCOUNTER — Ambulatory Visit (HOSPITAL_COMMUNITY): Payer: Medicaid Other

## 2019-04-24 ENCOUNTER — Telehealth (INDEPENDENT_AMBULATORY_CARE_PROVIDER_SITE_OTHER): Payer: Medicaid Other | Admitting: Obstetrics and Gynecology

## 2019-04-24 ENCOUNTER — Encounter: Payer: Self-pay | Admitting: Obstetrics and Gynecology

## 2019-04-24 ENCOUNTER — Ambulatory Visit (HOSPITAL_COMMUNITY): Payer: Medicaid Other

## 2019-04-24 ENCOUNTER — Other Ambulatory Visit: Payer: Self-pay

## 2019-04-24 DIAGNOSIS — O0990 Supervision of high risk pregnancy, unspecified, unspecified trimester: Secondary | ICD-10-CM

## 2019-04-24 DIAGNOSIS — O0933 Supervision of pregnancy with insufficient antenatal care, third trimester: Secondary | ICD-10-CM

## 2019-04-24 NOTE — Progress Notes (Signed)
I connected with  Kathleen Curtis on 04/24/19 at  1:55 PM EST by telephone and verified that I am speaking with the correct person using two identifiers.   I discussed the limitations, risks, security and privacy concerns of performing an evaluation and management service by telephone and the availability of in person appointments. I also discussed with the patient that there may be a patient responsible charge related to this service. The patient expressed understanding and agreed to proceed.  Henrietta Dine, CMA 04/24/2019  1:53 PM

## 2019-04-24 NOTE — Progress Notes (Signed)
Pt checked by RN but informed me she had already delivered in [place]. She sis doing well. Will schedule her for 4 week pp visit.    Baldemar Lenis, M.D. Attending Center for [employer] [facility])

## 2019-05-01 ENCOUNTER — Encounter: Payer: Medicaid Other | Admitting: Advanced Practice Midwife

## 2019-05-22 ENCOUNTER — Telehealth: Payer: Self-pay | Admitting: Nurse Practitioner

## 2019-05-22 NOTE — Telephone Encounter (Signed)
Attempted to contact patient with an appointment time change on 2/23. No answer, left a voicemail informing patient that her new appointment time is 1:35 on 2/23 instead of 10:15. Patient instructed that the visit is still a virtual visit.

## 2019-05-23 ENCOUNTER — Encounter: Payer: Self-pay | Admitting: Medical

## 2019-05-23 ENCOUNTER — Telehealth (INDEPENDENT_AMBULATORY_CARE_PROVIDER_SITE_OTHER): Payer: Medicaid Other | Admitting: Medical

## 2019-05-23 NOTE — Progress Notes (Signed)
Patient delivered in [place]. Will come sign ROI for records from delivery. Only had 1 virtual visit at [facility] during pregnancy. Is living locally now. Will reschedule after records are received.   Marny Lowenstein, PA-C 05/23/2019 11:39 AM

## 2020-03-20 IMAGING — US OBSTETRIC <14 WK US AND TRANSVAGINAL OB US
1 series · 15 of 21 positions shown · non-contrast
Comparison: No prior.

CLINICAL DATA: Pregnancy.  Cramping.  Bleeding.

EXAM:
OBSTETRIC <14 WK ULTRASOUND
TECHNIQUE: Transabdominal ultrasound was performed for evaluation of the
gestation as well as the maternal uterus and adnexal regions.

[Series 1: obstetric <14 wk us and transvaginal ob us · 21 acquisitions, 15 frames shown]
[im 1/21]
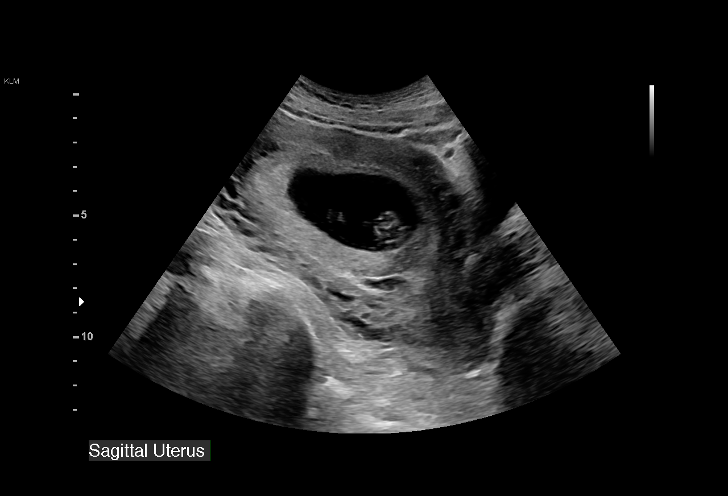
[im 3/21]
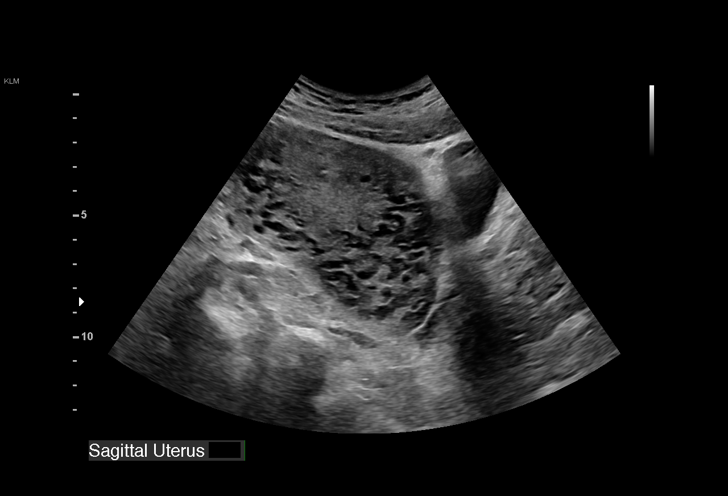
[im 4/21]
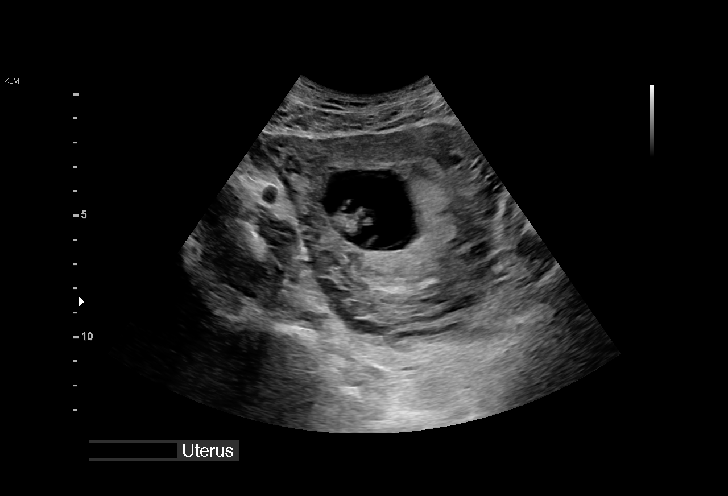
[im 5/21]
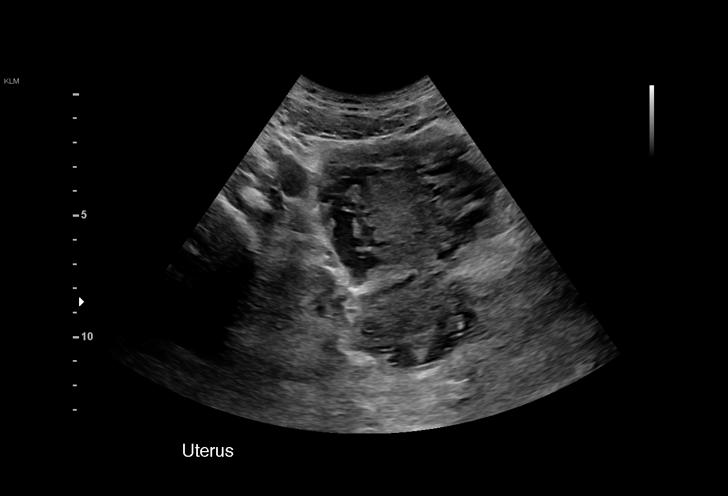
[im 7/21]
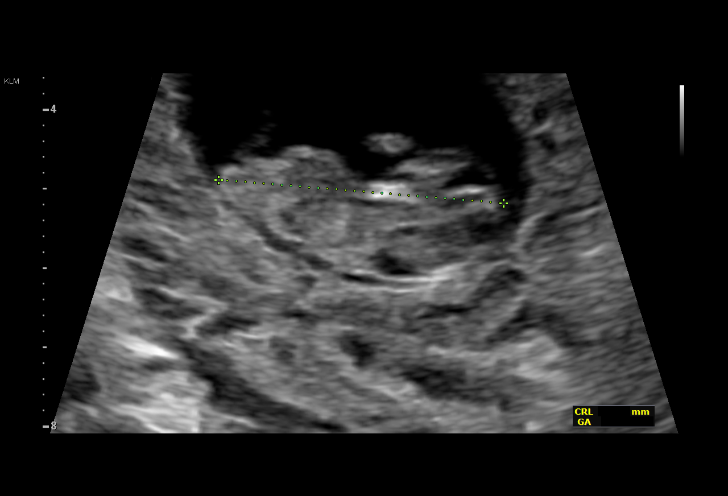
[im 8/21]
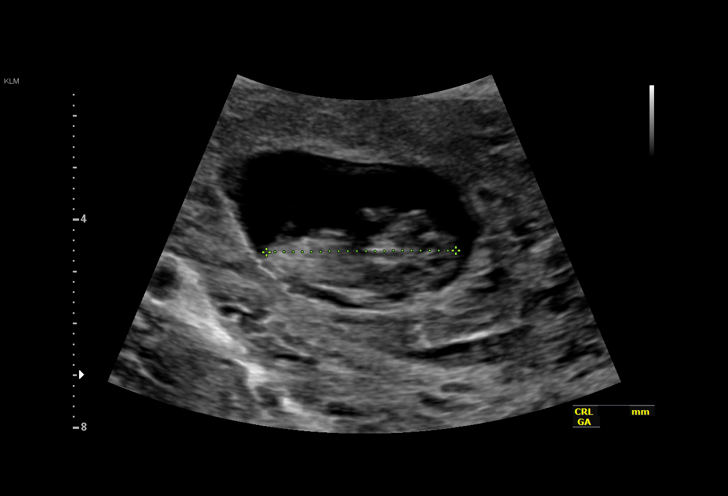
[im 10/21]
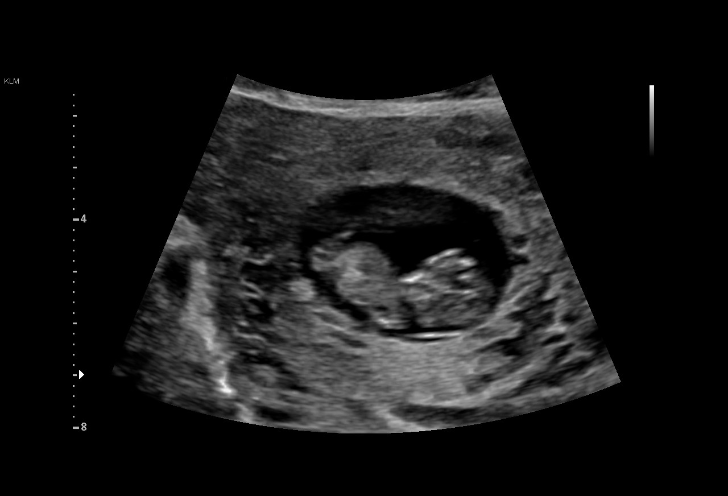
[im 11/21]
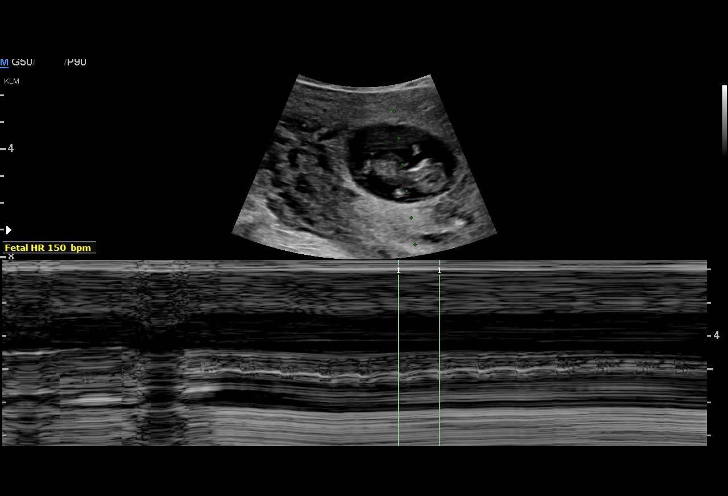
[im 12/21]
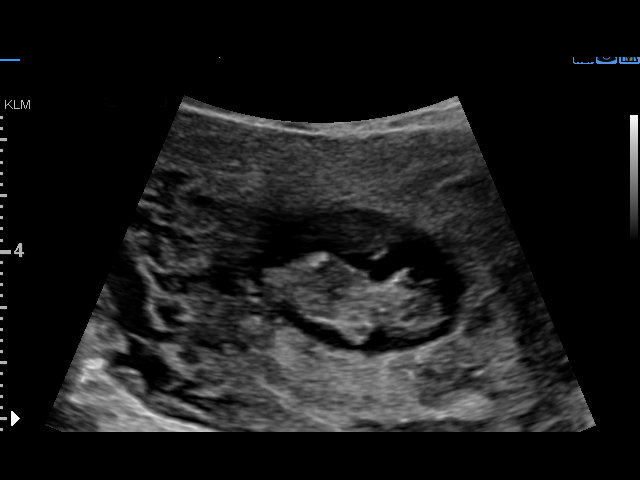
[im 14/21]
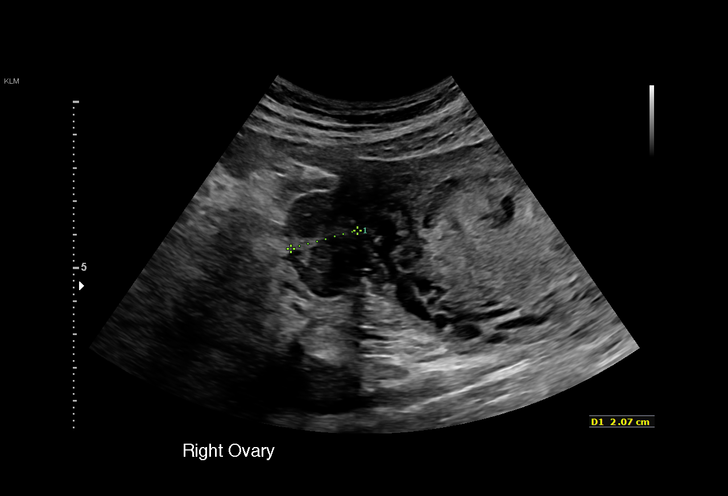
[im 15/21]
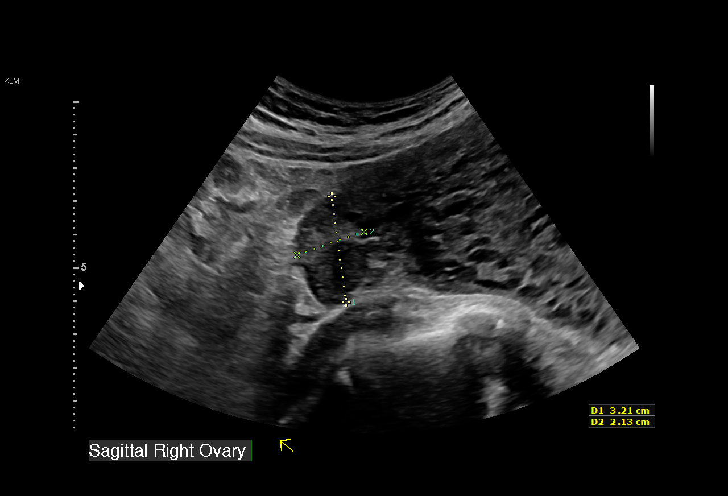
[im 17/21]
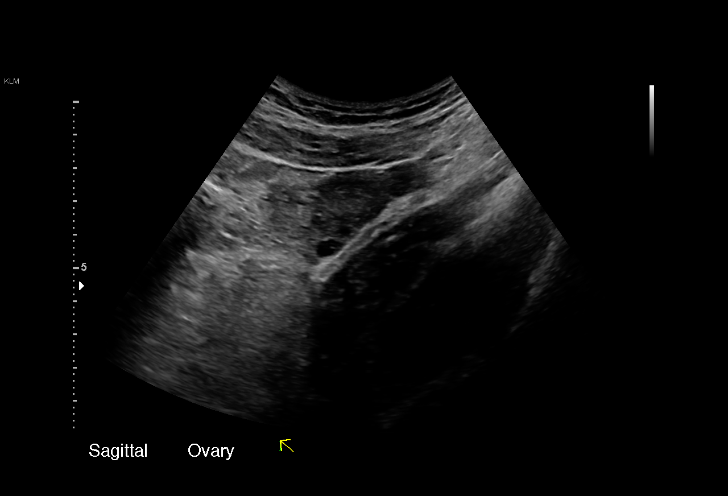
[im 18/21]
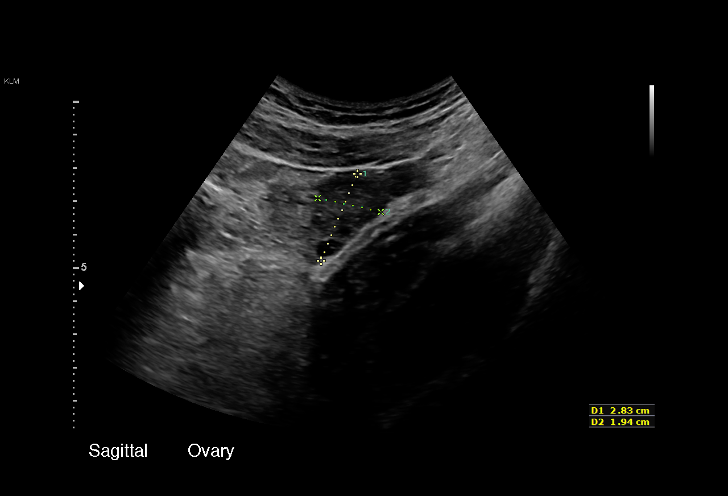
[im 19/21]
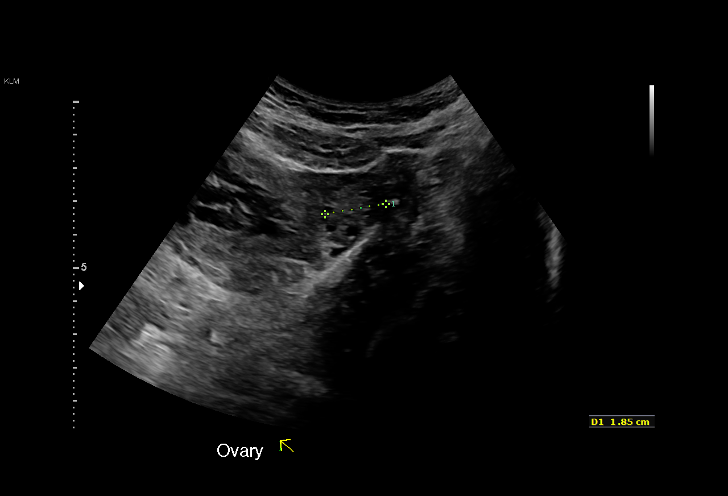
[im 21/21]
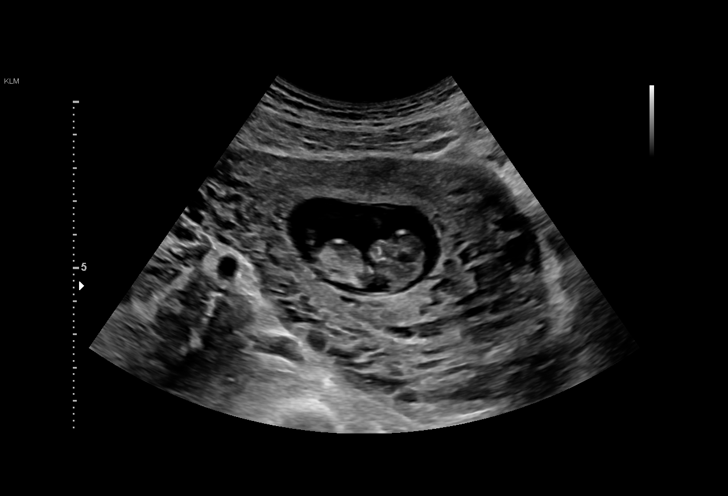

[15 of 21 positions shown; findings below may reference images not displayed]

FINDINGS: Intrauterine gestational sac: Single

Yolk sac:  No

Embryo:  Yes

Cardiac Activity: Yes

Heart Rate: 150 bpm

CRL: 36.2 mm   10 w 4 d                  US EDC: 04/27/2000

Subchorionic hemorrhage:  None

Maternal uterus/adnexae: Prominent vasculature noted about the
uterus most likely related to the patient's pregnancy. Tiny right
ovarian complex cyst most likely corpus luteal cyst.
IMPRESSION: 1.  Single viable intrauterine pregnancy at 10 weeks 4 days.

2. Prominent vasculature noted about the uterus most likely related
to the patient's pregnancy. Continued follow-up suggested to
demonstrate stability. Tiny right ovarian corpus luteal cyst.

## 2020-05-08 IMAGING — US US MFM OB LIMITED
1 series · 15 of 23 positions shown · non-contrast
Comparison: none

[Series 1: us mfm ob limited · 23 acquisitions, 15 frames shown]
[im 1/23]
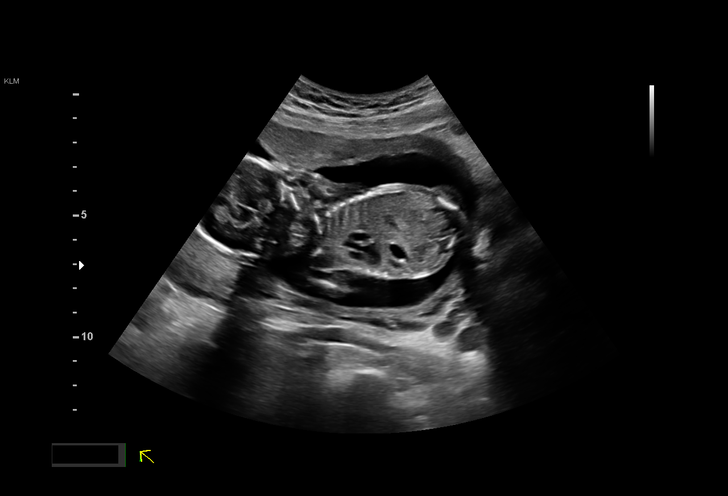
[im 3/23]
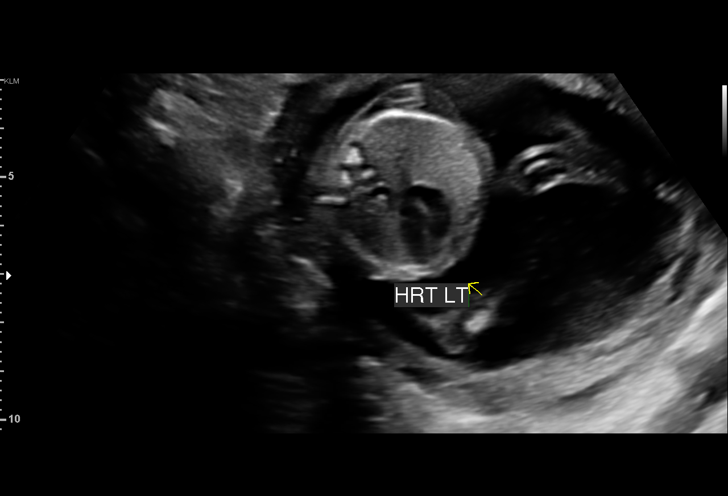
[im 4/23]
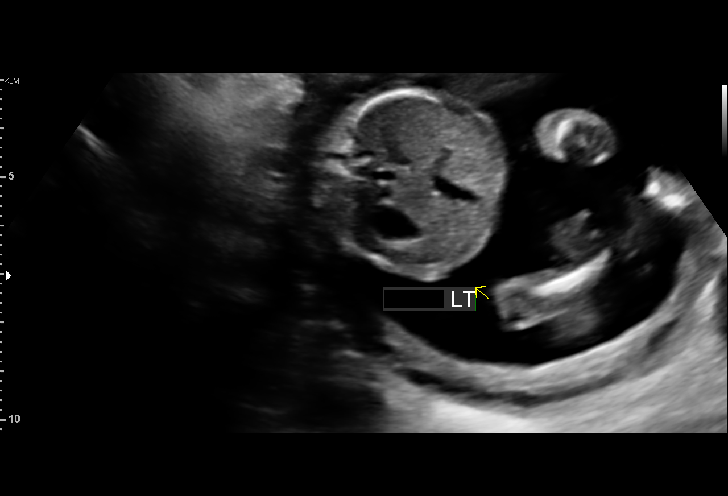
[im 6/23]
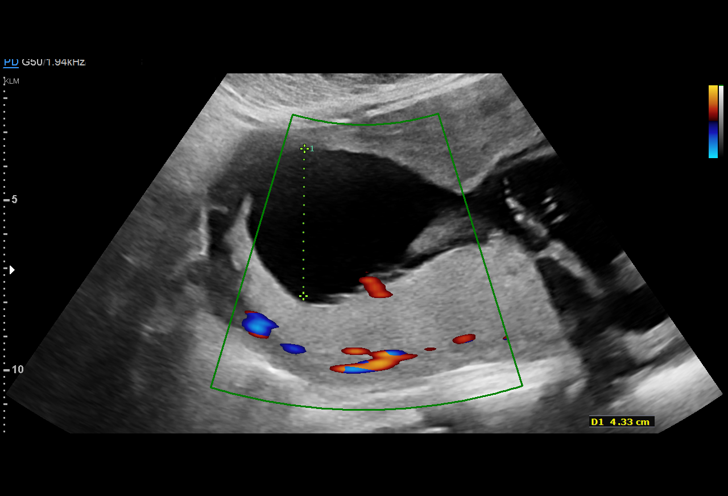
[im 7/23]
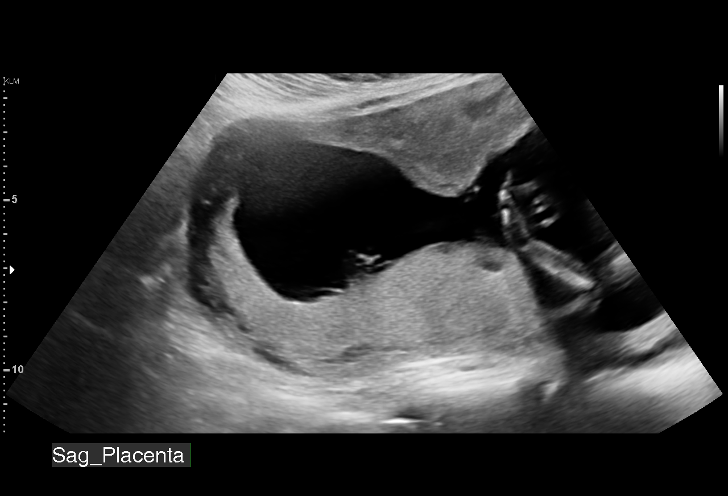
[im 9/23]
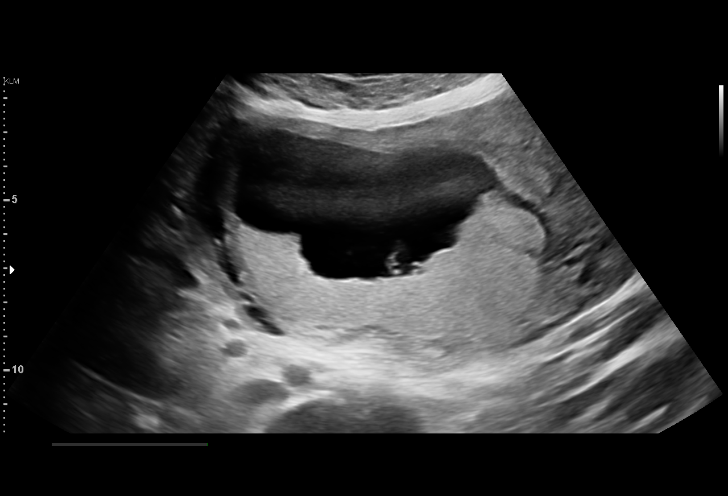
[im 10/23]
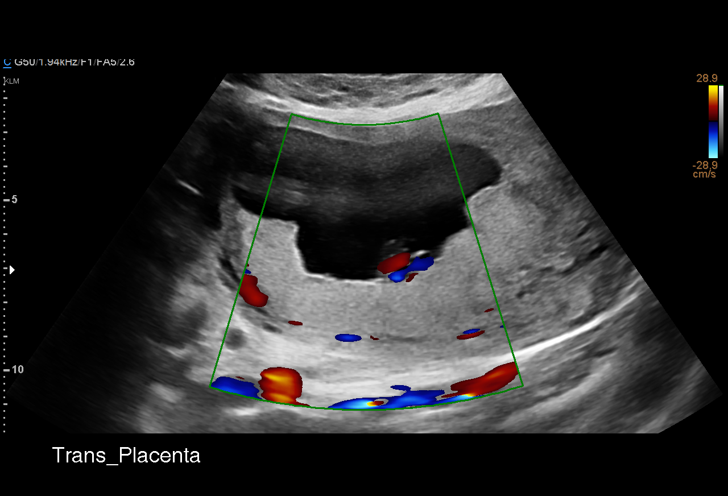
[im 12/23]
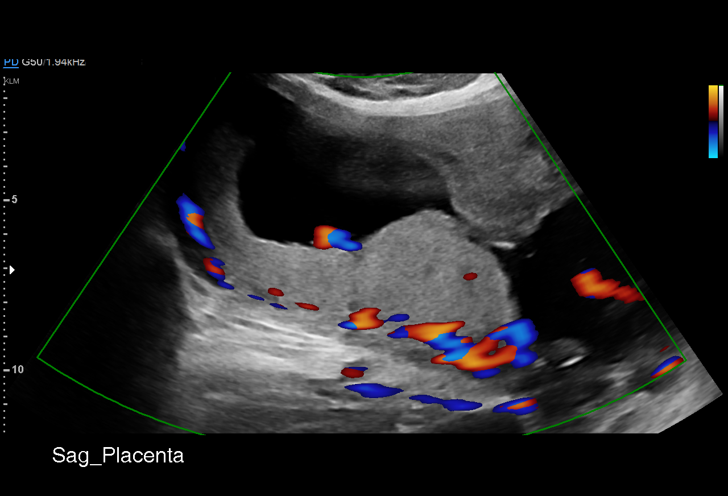
[im 14/23]
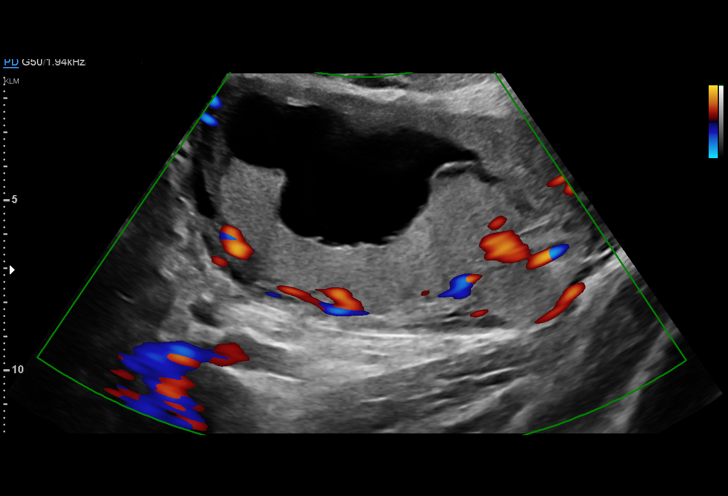
[im 15/23]
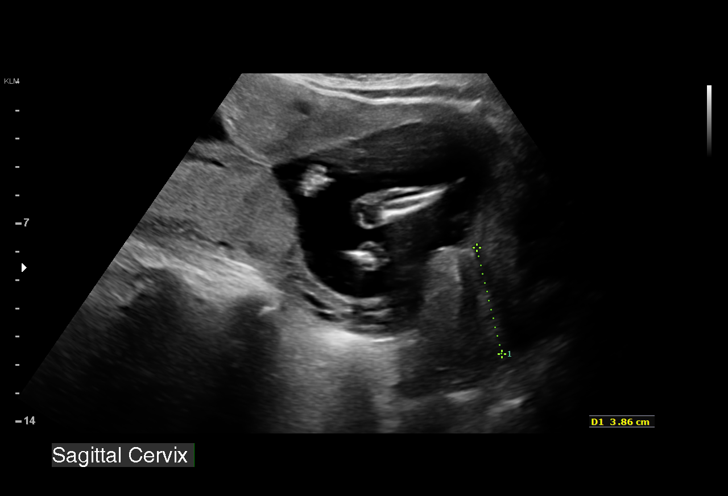
[im 17/23]
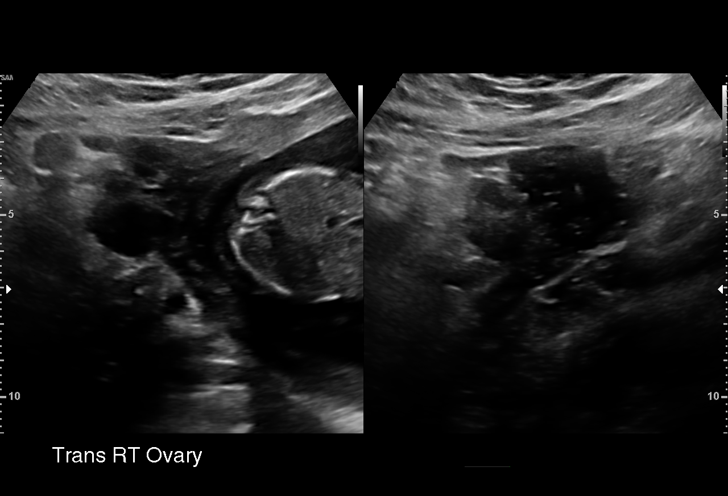
[im 18/23]
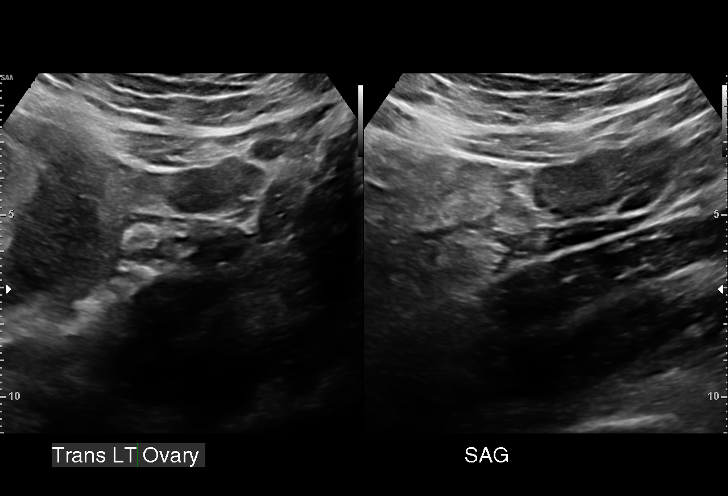
[im 20/23]
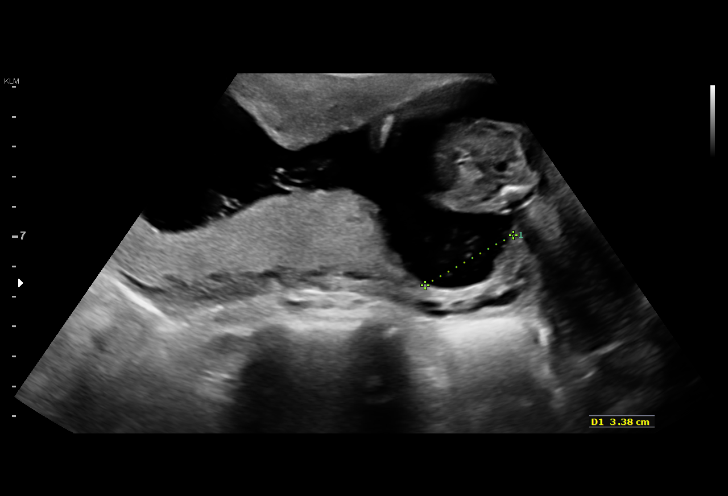
[im 21/23]
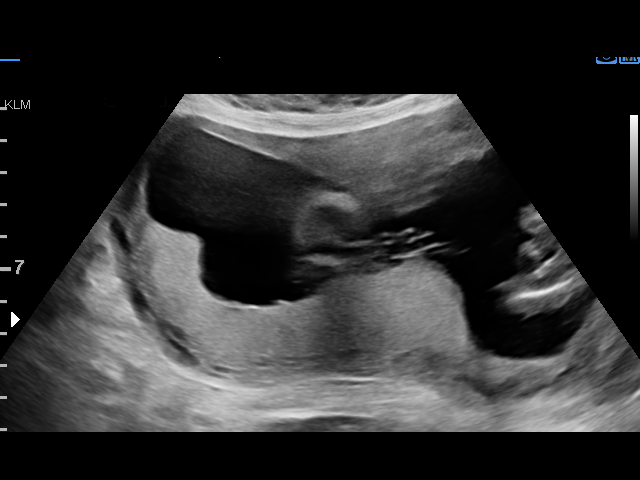
[im 23/23]
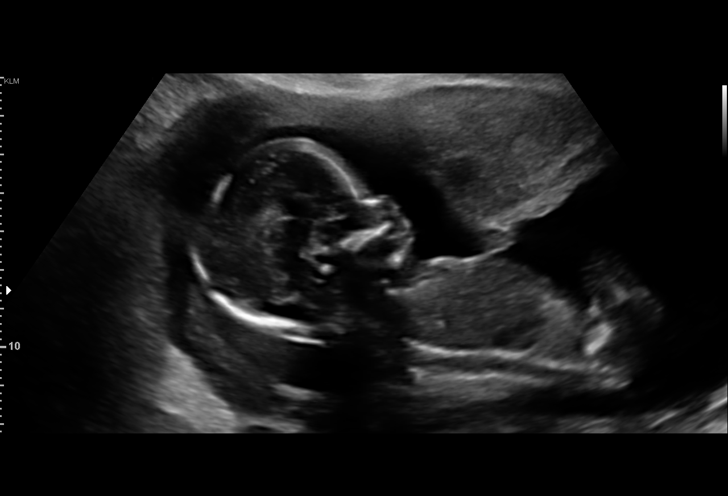

[15 of 23 positions shown; findings below may reference images not displayed]

MAU/Triage
                   MAGNA

  1  US MFM OB LIMITED                    [id number]     LKW TIGER
 ----------------------------------------------------------------------

 ----------------------------------------------------------------------
Indications

  Pelvic pain affecting pregnancy in second
  trimester
  17 weeks gestation of pregnancy
  Vaginal bleeding in pregnancy, second
  trimester
  Poor obstetric history: Previous preterm
  delivery, antepartum (34 weeks)
 ----------------------------------------------------------------------
Fetal Evaluation

 Num Of Fetuses:         1
 Fetal Heart Rate(bpm):  148
 Cardiac Activity:       Observed
 Presentation:           Breech
 Placenta:               Posterior
 P. Cord Insertion:      Visualized

 Amniotic Fluid
 AFI FV:      Within normal limits

                             Largest Pocket(cm)


 Comment:    No placental abruption or previa identified.
OB History

 Gravidity:    6         Term:   1        Prem:   1        SAB:   2
 TOP:          1       Ectopic:  0        Living: 2
Gestational Age
 LMP:           20w 5d        Date:  06/30/18                 EDD:   04/06/19
 Best:          17w 4d     Det. By:  Early Ultrasound         EDD:   04/28/19
                                     (10/04/18)
Anatomy

 Stomach:               Appears normal, left   Bladder:                Appears normal
                        sided
Cervix Uterus Adnexa

 Cervix
 Length:            3.9  cm.
 Normal appearance by transabdominal scan.

 Uterus
 No abnormality visualized.

 Left Ovary
 Within normal limits.

 Right Ovary
 Within normal limits.

 Cul De Sac
 No free fluid seen.

 Adnexa
 No abnormality visualized.
Impression

 Vaginal bleeding
 Limited exam
 No evidence of placenta previa or abruption
Recommendations

 Follow up as clinically indicated.
